# Patient Record
Sex: Male | Born: 1995
Health system: Southern US, Community
[De-identification: ages and names within clinical notes are randomized; demographics above are authoritative.]

## PROBLEM LIST (undated history)

## (undated) DIAGNOSIS — F329 Major depressive disorder, single episode, unspecified: Secondary | ICD-10-CM

## (undated) DIAGNOSIS — R519 Headache, unspecified: Secondary | ICD-10-CM

## (undated) DIAGNOSIS — R51 Headache: Secondary | ICD-10-CM

## (undated) DIAGNOSIS — F32A Depression, unspecified: Secondary | ICD-10-CM

## (undated) HISTORY — DX: Headache, unspecified: R51.9

## (undated) HISTORY — DX: Major depressive disorder, single episode, unspecified: F32.9

## (undated) HISTORY — DX: Depression, unspecified: F32.A

## (undated) HISTORY — DX: Headache: R51

---
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
## 2015-06-30 DIAGNOSIS — J0300 Acute streptococcal tonsillitis, unspecified: Secondary | ICD-10-CM | POA: Diagnosis not present

## 2015-09-22 DIAGNOSIS — R079 Chest pain, unspecified: Secondary | ICD-10-CM | POA: Diagnosis not present

## 2015-09-22 DIAGNOSIS — M549 Dorsalgia, unspecified: Secondary | ICD-10-CM | POA: Diagnosis not present

## 2015-09-22 DIAGNOSIS — M940 Chondrocostal junction syndrome [Tietze]: Secondary | ICD-10-CM | POA: Diagnosis not present

## 2016-01-17 DIAGNOSIS — R110 Nausea: Secondary | ICD-10-CM | POA: Diagnosis not present

## 2016-01-17 DIAGNOSIS — Z888 Allergy status to other drugs, medicaments and biological substances status: Secondary | ICD-10-CM | POA: Diagnosis not present

## 2016-01-17 DIAGNOSIS — Z880 Allergy status to penicillin: Secondary | ICD-10-CM | POA: Diagnosis not present

## 2016-01-17 DIAGNOSIS — F17200 Nicotine dependence, unspecified, uncomplicated: Secondary | ICD-10-CM | POA: Diagnosis not present

## 2016-01-17 DIAGNOSIS — R51 Headache: Secondary | ICD-10-CM | POA: Diagnosis not present

## 2016-01-17 DIAGNOSIS — Z720 Tobacco use: Secondary | ICD-10-CM | POA: Diagnosis not present

## 2016-01-17 DIAGNOSIS — H53149 Visual discomfort, unspecified: Secondary | ICD-10-CM | POA: Diagnosis not present

## 2016-02-29 DIAGNOSIS — J0300 Acute streptococcal tonsillitis, unspecified: Secondary | ICD-10-CM | POA: Diagnosis not present

## 2016-02-29 DIAGNOSIS — J029 Acute pharyngitis, unspecified: Secondary | ICD-10-CM | POA: Diagnosis not present

## 2016-02-29 DIAGNOSIS — R05 Cough: Secondary | ICD-10-CM | POA: Diagnosis not present

## 2016-03-02 DIAGNOSIS — J020 Streptococcal pharyngitis: Secondary | ICD-10-CM | POA: Diagnosis not present

## 2016-07-31 DIAGNOSIS — R197 Diarrhea, unspecified: Secondary | ICD-10-CM | POA: Diagnosis not present

## 2016-07-31 DIAGNOSIS — R509 Fever, unspecified: Secondary | ICD-10-CM | POA: Diagnosis not present

## 2016-07-31 DIAGNOSIS — R5383 Other fatigue: Secondary | ICD-10-CM | POA: Diagnosis not present

## 2016-07-31 DIAGNOSIS — S0096XA Insect bite (nonvenomous) of unspecified part of head, initial encounter: Secondary | ICD-10-CM | POA: Diagnosis not present

## 2016-08-01 DIAGNOSIS — R509 Fever, unspecified: Secondary | ICD-10-CM | POA: Diagnosis not present

## 2016-08-01 DIAGNOSIS — R197 Diarrhea, unspecified: Secondary | ICD-10-CM | POA: Diagnosis not present

## 2016-09-13 ENCOUNTER — Ambulatory Visit
Admission: EM | Admit: 2016-09-13 | Discharge: 2016-09-13 | Disposition: A | Discharge status: Home / Self Care | Payer: Worker's Compensation | Attending: Family Medicine | Admitting: Family Medicine

## 2016-09-13 DIAGNOSIS — Z23 Encounter for immunization: Secondary | ICD-10-CM

## 2016-09-13 DIAGNOSIS — T6591XA Toxic effect of unspecified substance, accidental (unintentional), initial encounter: Secondary | ICD-10-CM

## 2016-09-13 DIAGNOSIS — T22211A Burn of second degree of right forearm, initial encounter: Secondary | ICD-10-CM

## 2016-09-13 MED ORDER — TETANUS-DIPHTH-ACELL PERTUSSIS 5-2.5-18.5 LF-MCG/0.5 IM SUSP
0.5000 mL | Freq: Once | INTRAMUSCULAR | Status: AC
  Administered 2016-09-13: 0.5 mL via INTRAMUSCULAR

## 2016-09-13 MED ORDER — NAPROXEN 500 MG PO TABS: 500.0000 mg | ORAL_TABLET | Freq: Two times a day (BID) | ORAL | 0 refills | Status: DC

## 2016-09-13 MED ORDER — MUPIROCIN 2 % EX OINT: 1.0000 "application " | TOPICAL_OINTMENT | Freq: Three times a day (TID) | CUTANEOUS | 0 refills | Status: DC

## 2016-09-13 NOTE — ED Triage Notes (Signed)
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 Pain 7/10. Skin pink in areas of contact.
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
## 2016-10-11 DIAGNOSIS — H35413 Lattice degeneration of retina, bilateral: Secondary | ICD-10-CM | POA: Diagnosis not present

## 2016-10-11 DIAGNOSIS — H43812 Vitreous degeneration, left eye: Secondary | ICD-10-CM | POA: Diagnosis not present

## 2016-10-26 ENCOUNTER — Ambulatory Visit (INDEPENDENT_AMBULATORY_CARE_PROVIDER_SITE_OTHER): Payer: BLUE CROSS/BLUE SHIELD | Admitting: Family Medicine

## 2016-10-26 ENCOUNTER — Encounter: Payer: Self-pay | Admitting: Family Medicine

## 2016-10-26 VITALS — BP 120/80 | HR 101 | Temp 98.6°F | Ht 70.0 in | Wt 296.6 lb

## 2016-10-26 DIAGNOSIS — Z0000 Encounter for general adult medical examination without abnormal findings: Secondary | ICD-10-CM

## 2016-10-26 DIAGNOSIS — Z23 Encounter for immunization: Secondary | ICD-10-CM | POA: Diagnosis not present

## 2016-10-26 NOTE — Patient Instructions (Signed)
Let me know if you decide to pursue sleep study or any labs.

## 2016-10-26 NOTE — Progress Notes (Signed)
Subjective:     Patient ID: Jeffery Barrett, male   DOB: 18-Oct-1995, 21 y.o.   MRN: [id number]  HPI Patient seen to establish care and requesting well visit/physical. He is requiring this for his work. He currently takes no medications. He states has been diagnosed in the past with attention deficit disorder and obsessive-compulsive disorder. He apparently is treated with medications for OCD but did not like the way they made him feel. He states he felt somewhat "flat ".  He has history of obesity and has lost already about 25 pounds this year due to his efforts with scaling back sugars and starches. He has history of very large tonsils and suspected sleep apnea but has never been studied. Denies any significant daytime somnolence. He states his parents have observed that he snores loudly and sometimes quits breathing at night. He has declined further studies at this time.  Patient is single. Graduated from technical school for Runner, broadcasting/film/video. Smokes electronic cigarettes but not daily. Occasional alcohol use but not regularly. Denies illicit drug use.  Current job is Careers adviser.    Had tetanus booster one month ago. Requesting flu vaccine. No contraindications.  Past Medical History:  Diagnosis Date  . Depression    No past surgical history on file.  reports that he has quit smoking. His smoking use included E-cigarettes. He has never used smokeless tobacco. He reports that he drinks alcohol. He reports that he does not use drugs. family history includes Arthritis in his paternal grandmother; COPD in his maternal grandfather; Cancer in his maternal grandmother; Depression in his father, paternal grandfather, and sister; Early death in his maternal grandfather; Hearing loss in his father; Heart disease in his paternal grandfather and paternal grandmother; Hypertension in his mother and paternal grandmother; Miscarriages / [place] in his mother; Stroke in his paternal  grandmother. Allergies  Allergen Reactions  . Amoxicillin Hives  . Cephalosporins Hives     Review of Systems  Constitutional: Negative for activity change, appetite change and fever.  HENT: Negative for congestion, ear pain and trouble swallowing.   Eyes: Negative for pain and visual disturbance.  Respiratory: Negative for cough, shortness of breath and wheezing.   Cardiovascular: Negative for chest pain and palpitations.  Gastrointestinal: Negative for abdominal distention, abdominal pain, blood in stool, constipation, diarrhea, nausea, rectal pain and vomiting.  Endocrine: Negative for polydipsia and polyuria.  Genitourinary: Negative for dysuria, hematuria and testicular pain.  Musculoskeletal: Negative for arthralgias and joint swelling.  Skin: Negative for rash.  Neurological: Negative for dizziness, syncope and headaches.  Hematological: Negative for adenopathy.  Psychiatric/Behavioral: Negative for confusion and dysphoric mood.       Objective:   Physical Exam  Constitutional: He is oriented to person, place, and time. He appears well-developed and well-nourished. No distress.  HENT:  Head: Normocephalic and atraumatic.  Right Ear: External ear normal.  Left Ear: External ear normal.  Patient is very large tonsils which are symmetric and without erythema or exudate  Eyes: Pupils are equal, round, and reactive to light. Conjunctivae and EOM are normal.  Neck: Normal range of motion. Neck supple. No thyromegaly present.  Cardiovascular: Normal rate, regular rhythm and normal heart sounds.   No murmur heard. Pulmonary/Chest: No respiratory distress. He has no wheezes. He has no rales.  Abdominal: Soft. Bowel sounds are normal. He exhibits no distension and no mass. There is no tenderness. There is no rebound and no guarding.  Musculoskeletal: He exhibits no edema.  Lymphadenopathy:  He has no cervical adenopathy.  Neurological: He is alert and oriented to person,  place, and time. He displays normal reflexes. No cranial nerve deficit.  Skin: No rash noted.  Psychiatric: He has a normal mood and affect.       Assessment:     Physical exam. Patient here to establish care. Several issues addressed as below. He has history of reported ADD and OCD currently not treated for either. Very likely has obstructive sleep apnea but is declining further evaluation at this time    Plan:     -Flu vaccine given -Encourage continue weight loss and discussed strategies for ongoing weight loss and maintenance -We suggested sleep study and discussed implications of obstructive sleep apnea and hopefully will improve with time with further weight loss.  He declines sleep study at this time. -Also discussed potential treatments for obsessive-compulsive disorder but at this point he feels this is not dysfunctional. -Recommend consider screening lab work and he declines  Kristian CoveyBruce W Burchette MD [facility]

## 2016-12-02 DIAGNOSIS — R112 Nausea with vomiting, unspecified: Secondary | ICD-10-CM | POA: Diagnosis not present

## 2016-12-02 DIAGNOSIS — A09 Infectious gastroenteritis and colitis, unspecified: Secondary | ICD-10-CM | POA: Diagnosis not present

## 2016-12-06 DIAGNOSIS — H43812 Vitreous degeneration, left eye: Secondary | ICD-10-CM | POA: Diagnosis not present

## 2016-12-06 DIAGNOSIS — H35413 Lattice degeneration of retina, bilateral: Secondary | ICD-10-CM | POA: Diagnosis not present

## 2017-01-20 ENCOUNTER — Encounter: Payer: Self-pay | Admitting: *Deleted

## 2017-01-20 ENCOUNTER — Ambulatory Visit: Payer: Self-pay | Admitting: Physician Assistant

## 2017-01-20 NOTE — Telephone Encounter (Signed)
This encounter was created in error - please disregard.

## 2017-05-03 ENCOUNTER — Ambulatory Visit: Payer: BLUE CROSS/BLUE SHIELD | Admitting: Family Medicine

## 2017-05-03 ENCOUNTER — Ambulatory Visit (INDEPENDENT_AMBULATORY_CARE_PROVIDER_SITE_OTHER)
Admission: RE | Admit: 2017-05-03 | Discharge: 2017-05-03 | Disposition: A | Discharge status: Home / Self Care | Payer: BLUE CROSS/BLUE SHIELD | Source: Ambulatory Visit | Attending: Family Medicine | Admitting: Family Medicine

## 2017-05-03 ENCOUNTER — Encounter: Payer: Self-pay | Admitting: Family Medicine

## 2017-05-03 DIAGNOSIS — R519 Headache, unspecified: Secondary | ICD-10-CM

## 2017-05-03 DIAGNOSIS — R51 Headache: Secondary | ICD-10-CM | POA: Diagnosis not present

## 2017-05-03 NOTE — Patient Instructions (Signed)
General Headache Without Cause A headache is pain or discomfort felt around the head or neck area. The specific cause of a headache may not be found. There are many causes and types of headaches. A few common ones are:  Tension headaches.  Migraine headaches.  Cluster headaches.  Chronic daily headaches.  Follow these instructions at home: Watch your condition for any changes. Take these steps to help with your condition: Managing pain  Take over-the-counter and prescription medicines only as told by your health care provider.  Lie down in a dark, quiet room when you have a headache.  If directed, apply ice to the head and neck area: ? Put ice in a plastic bag. ? Place a towel between your skin and the bag. ? Leave the ice on for 20 minutes, 2-3 times per day.  Use a heating pad or hot shower to apply heat to the head and neck area as told by your health care provider.  Keep lights dim if bright lights bother you or make your headaches worse. Eating and drinking  Eat meals on a regular schedule.  Limit alcohol use.  Decrease the amount of caffeine you drink, or stop drinking caffeine. General instructions  Keep all follow-up visits as told by your health care provider. This is important.  Keep a headache journal to help find out what may trigger your headaches. For example, write down: ? What you eat and drink. ? How much sleep you get. ? Any change to your diet or medicines.  Try massage or other relaxation techniques.  Limit stress.  Sit up straight, and do not tense your muscles.  Do not use tobacco products, including cigarettes, chewing tobacco, or e-cigarettes. If you need help quitting, ask your health care provider.  Exercise regularly as told by your health care provider.  Sleep on a regular schedule. Get 7-9 hours of sleep, or the amount recommended by your health care provider. Contact a health care provider if:  Your symptoms are not helped by  medicine.  You have a headache that is different from the usual headache.  You have nausea or you vomit.  You have a fever. Get help right away if:  Your headache becomes severe.  You have repeated vomiting.  You have a stiff neck.  You have a loss of vision.  You have problems with speech.  You have pain in the eye or ear.  You have muscular weakness or loss of muscle control.  You lose your balance or have trouble walking.  You feel faint or pass out.  You have confusion. This information is not intended to replace advice given to you by your health care provider. Make sure you discuss any questions you have with your health care provider. Document Released: 12/20/2004 Document Revised: 05/28/2015 Document Reviewed: 04/14/2014 Elsevier Interactive Patient Education  2018 Elsevier Inc.  
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
## 2017-05-05 ENCOUNTER — Telehealth: Payer: Self-pay | Admitting: *Deleted

## 2017-05-05 ENCOUNTER — Other Ambulatory Visit: Payer: Self-pay | Admitting: Family Medicine

## 2017-05-05 DIAGNOSIS — R519 Headache, unspecified: Secondary | ICD-10-CM

## 2017-05-05 DIAGNOSIS — R51 Headache: Principal | ICD-10-CM

## 2017-05-05 NOTE — Telephone Encounter (Signed)
Copied from CRM [id number]. Topic: Quick Communication - Other Results >> May 04, 2017 12:26 PM Jeffery Barrett wrote: Pt calling back for results of CT scan.  Pt states he is still having headaches Pt wants to know if anything he should not do. Pt wants to know if ok to go 4 wheeling this weekend

## 2017-05-05 NOTE — Telephone Encounter (Signed)
Left message on machine for patient to return our call.  CMR created

## 2017-05-05 NOTE — Telephone Encounter (Signed)
See result note.  

## 2017-05-05 NOTE — Telephone Encounter (Signed)
CT no abnormality.  I would NOT recommend 4 wheeling this weekend.  I would like him to be in touch by early next week if headache not improved.  If not better by then will recommend neurology evaluation.

## 2017-05-23 ENCOUNTER — Encounter: Payer: Self-pay | Admitting: Neurology

## 2017-05-25 ENCOUNTER — Encounter: Payer: Self-pay | Admitting: *Deleted

## 2017-05-25 ENCOUNTER — Ambulatory Visit: Payer: BLUE CROSS/BLUE SHIELD | Admitting: Family Medicine

## 2017-05-25 ENCOUNTER — Encounter: Payer: Self-pay | Admitting: Family Medicine

## 2017-05-25 VITALS — BP 100/64 | HR 86 | Temp 98.3°F | Ht 70.0 in | Wt 301.7 lb

## 2017-05-25 DIAGNOSIS — H6980 Other specified disorders of Eustachian tube, unspecified ear: Secondary | ICD-10-CM | POA: Diagnosis not present

## 2017-05-25 DIAGNOSIS — R42 Dizziness and giddiness: Secondary | ICD-10-CM | POA: Diagnosis not present

## 2017-05-25 DIAGNOSIS — J351 Hypertrophy of tonsils: Secondary | ICD-10-CM | POA: Diagnosis not present

## 2017-05-25 DIAGNOSIS — R51 Headache: Secondary | ICD-10-CM | POA: Diagnosis not present

## 2017-05-25 DIAGNOSIS — R509 Fever, unspecified: Secondary | ICD-10-CM | POA: Diagnosis not present

## 2017-05-25 DIAGNOSIS — R519 Headache, unspecified: Secondary | ICD-10-CM

## 2017-05-25 LAB — POCT RAPID STREP A (OFFICE): Rapid Strep A Screen: POSITIVE — AB

## 2017-05-25 LAB — POC INFLUENZA A&B (BINAX/QUICKVUE)
Influenza A, POC: NEGATIVE
Influenza B, POC: NEGATIVE

## 2017-05-25 MED ORDER — AZITHROMYCIN 250 MG PO TABS: ORAL_TABLET | ORAL | 0 refills | Status: DC

## 2017-05-25 NOTE — Patient Instructions (Addendum)
BEFORE YOU LEAVE: -rapid strep and flu testing -work note - do not return to work until 24 hours on antibiotic (05/27/17) -follow up: Monday or Tuesday for recheck with PCP  Take the antibiotic as instructed.  Plenty of fluids.  Keep neurology appointment.  Seek care if recurrent or worsening symptoms, high fevers, rash, severe or worsening headaches, other concerns.

## 2017-05-25 NOTE — Progress Notes (Addendum)
HPI:  Using dictation device. Unfortunately this device frequently misinterprets words/phrases.  Acute visit for headaches: -x 1 month -reports seeing PCP for this and had CT and has appointment with neurology for eval -2 days ago saw tick on him, lone star tick has pic -2 days ago started feeling sick with fevers, up to 104 at home per his report, nausea, body aches, feeling out of it, continue headaches -denies SOB, vision changes, weakness, numbness, confusion, sore throat, cough, rash, tick bite, joint pains, swelling or pain in joints, neck stiffness -feeling better today with fevers resolved  ROS: See pertinent positives and negatives per HPI.  Past Medical History:  Diagnosis Date  . Depression     History reviewed. No pertinent surgical history.  Family History  Problem Relation Age of Onset  . Hypertension Mother   . Miscarriages / [place] Mother   . Depression Father   . Hearing loss Father   . Depression Sister   . Cancer Maternal Grandmother   . COPD Maternal Grandfather   . Early death Maternal Grandfather   . Arthritis Paternal Grandmother   . Heart disease Paternal Grandmother   . Hypertension Paternal Grandmother   . Stroke Paternal Grandmother   . Depression Paternal Grandfather   . Heart disease Paternal Grandfather     SOCIAL HX: see hpi   Current Outpatient Medications:  .  azithromycin (ZITHROMAX) 250 MG tablet, 2 tabs day 1, then one tab daily, Disp: 6 tablet, Rfl: 0  EXAM:  Vitals:   05/25/17 1351  BP: 100/64  Pulse: 86  Temp: 98.3 F (36.8 C)  SpO2: 98%    Body mass index is 43.29 kg/m.  GENERAL: vitals reviewed and listed above, alert, oriented, appears well hydrated and in no acute distress  HEENT: atraumatic, conjunttiva clear, no obvious abnormalities on inspection of external nose and ears, normal appearance of ear canals and TMs with bilateral clear effusion, yellow nasal congestion bilaterally, mild post oropharyngeal  erythema with PND, 2+ tonsillar edema edema without exudate, no sinus TTP  NECK: no obvious masses on inspection  LUNGS: clear to auscultation bilaterally, no wheezes, rales or rhonchi, good air movement  CV: HRRR, no peripheral edema  MS: moves all extremities without noticeable abnormality  PSYCH: CN II-XII grossly intact, finger to nose normal, speech and thought processing grossly intact, pleasant and cooperative, no obvious depression or anxiety  ASSESSMENT AND PLAN:  Discussed the following assessment and plan:  Fever, unspecified fever cause - Plan: POC Influenza A&B(BINAX/QUICKVUE), POC Rapid Strep A  Large tonsils - Plan: POC Rapid Strep A  Dysfunction of Eustachian tube, unspecified laterality  Nonintractable headache, unspecified chronicity pattern, unspecified headache type  Lightheaded  -vitals are normal with normal exam today except for upper resp findings and large tonsils (he reports always has large tonsils) but agreed to strep testing -strep test positive - treat with antibiotic, azithromycin given allergies -discussed tick borne illnesses and unlikely given onset of symptoms the same day he saw a tick, tick not biting and lone star tick - but did want him to seek immediate care if any more fevers, or sick -scheduled for appt with neurology for the headaches and seems these are not changed -recheck with PCP next week, particularly if not continuing to improve -Patient advised to return or notify a doctor immediately if symptoms worsen or persist or new concerns arise.  Patient Instructions  BEFORE YOU LEAVE: -rapid strep and flu testing -work note - do not return to work  until 24 hours on antibiotic (05/27/17) -follow up: Monday or Tuesday for recheck with PCP  Take the antibiotic as instructed.  Plenty of fluids.  Keep neurology appointment.  Seek care if recurrent or worsening symptoms, high fevers, rash, severe or worsening headaches, other  concerns.      Terressa Koyanagi, DO

## 2017-05-25 NOTE — Addendum Note (Signed)
Addended by: Terressa Koyanagi on: 05/25/2017 04:14 PM   Modules accepted: Orders

## 2017-05-25 NOTE — Addendum Note (Signed)
Addended by: Johnella Moloney on: 05/25/2017 02:57 PM   Modules accepted: Orders

## 2017-05-31 ENCOUNTER — Encounter: Payer: Self-pay | Admitting: Family Medicine

## 2017-05-31 ENCOUNTER — Ambulatory Visit: Payer: BLUE CROSS/BLUE SHIELD | Admitting: Family Medicine

## 2017-05-31 VITALS — BP 110/80 | HR 97 | Temp 98.4°F | Wt 304.8 lb

## 2017-05-31 DIAGNOSIS — R51 Headache: Secondary | ICD-10-CM

## 2017-05-31 DIAGNOSIS — R519 Headache, unspecified: Secondary | ICD-10-CM

## 2017-05-31 NOTE — Progress Notes (Signed)
  Subjective:     Patient ID: Jeffery Barrett, male   DOB: 05-Jun-1995, 22 y.o.   MRN: [id number]  HPI Patient seen last week with sudden fevers up to 104. He was noted to have enlarged tonsils with some erythema and even though he denied any sore throat rapid strep came back positive. He was treated with Zithromax and is asymptomatic at this time. No recurrent fever.  He's had some fleeting atypical unilateral headaches recently and had recent CT head unremarkable. He has pending follow-up with neurology in July. Denies recent exertional headache.  Very high risk for obstructive sleep apnea. He states his parents have noted that he does snore and has had occasional observed apnea episodes. Has some daytime fatigue but no significant daytime somnolence.  Recent reported tick bite (lone star tick)- but no rash, arthralgia, persistent fever, etc.  Past Medical History:  Diagnosis Date  . Depression    No past surgical history on file.  reports that he has quit smoking. His smoking use included e-cigarettes. He has never used smokeless tobacco. He reports that he drinks alcohol. He reports that he does not use drugs. family history includes Arthritis in his paternal grandmother; COPD in his maternal grandfather; Cancer in his maternal grandmother; Depression in his father, paternal grandfather, and sister; Early death in his maternal grandfather; Hearing loss in his father; Heart disease in his paternal grandfather and paternal grandmother; Hypertension in his mother and paternal grandmother; Miscarriages / [place] in his mother; Stroke in his paternal grandmother. Allergies  Allergen Reactions  . Amoxicillin Hives  . Cephalosporins Hives     Review of Systems  Constitutional: Negative for chills and fever.  HENT: Negative for congestion.   Respiratory: Negative for cough.   Neurological: Positive for headaches. Negative for dizziness, seizures, syncope, facial asymmetry, speech  difficulty and weakness.  Hematological: Negative for adenopathy.       Objective:   Physical Exam  Constitutional: He is oriented to person, place, and time. He appears well-developed and well-nourished.  HENT:  He has symmetrically enlarged tonsils with no exudate and no significant erythema this time  Neck: Neck supple.  Cardiovascular: Normal rate and regular rhythm.  Pulmonary/Chest: Effort normal and breath sounds normal. He has no wheezes. He has no rales.  Lymphadenopathy:    He has no cervical adenopathy.  Neurological: He is alert and oriented to person, place, and time. No cranial nerve deficit.       Assessment:     #1 recent strep pharyngitis symptomatically resolved following Zithromax  #2 atypical unilateral headaches. Question neuralgic headaches. Neurology follow-up pending. Recent CT head unremarkable  #3 high-risk for obstructive sleep apnea    Plan:     -We discussed getting possible sleep study but at this point he wishes to wait. Would certainly pursue for any progressive fatigue or daytime somnolence issues -Follow-up for any recurrent fever or other concerns -keep neurology follow up.  Would like to  Get him in sooner if possible.    Kristian Covey MD [facility]

## 2017-06-04 ENCOUNTER — Encounter (HOSPITAL_COMMUNITY): Payer: Self-pay | Admitting: Emergency Medicine

## 2017-06-04 ENCOUNTER — Other Ambulatory Visit: Payer: Self-pay

## 2017-06-04 ENCOUNTER — Ambulatory Visit (HOSPITAL_COMMUNITY)
Admission: EM | Admit: 2017-06-04 | Discharge: 2017-06-04 | Disposition: A | Discharge status: Home / Self Care | Payer: BLUE CROSS/BLUE SHIELD | Attending: Family Medicine | Admitting: Family Medicine

## 2017-06-04 DIAGNOSIS — J020 Streptococcal pharyngitis: Secondary | ICD-10-CM

## 2017-06-04 MED ORDER — CLINDAMYCIN HCL 300 MG PO CAPS: 300.0000 mg | ORAL_CAPSULE | Freq: Three times a day (TID) | ORAL | 0 refills | Status: DC

## 2017-06-04 NOTE — ED Triage Notes (Signed)
Finished medication for strep throat on Monday 5/27.  Felt good at that time.   Throat started getting sore again yesterday, feeling tired, general body aches, chills

## 2017-06-04 NOTE — ED Provider Notes (Signed)
[facility]    CSN: [id number] Arrival date & time: 06/04/17  1928     History   Chief Complaint Chief Complaint  Patient presents with  . Sore Throat    HPI Jeffery Barrett is a 22 y.o. male.   HPI  Patient states that he had strep throat about 2 weeks ago.  He went to a different urgent care center.  He was diagnosed with a rapid strep test.  He was treated with a Z-Pak.  His last dose of azithromycin was 5/27 / 19.  He states that he felt better on the antibiotic, then day before yesterday he started to feel tired, achy, and ran a fever.  Today he has a painful sore throat.  No runny nose.  No cough.  No nausea or vomiting.  He states it feels like he did when he had the strep. He has an allergy to penicillin and cephalosporins.  They both cause rash. He is here with his mother He is otherwise healthy Past Medical History:  Diagnosis Date  . Depression     There are no active problems to display for this patient.   History reviewed. No pertinent surgical history.     Home Medications    Prior to Admission medications   Medication Sig Start Date End Date Taking? Authorizing Provider  clindamycin (CLEOCIN) 300 MG capsule Take 1 capsule (300 mg total) by mouth 3 (three) times daily. 06/04/17   Eustace MooreNelson, Devonda Pequignot Sue, MD    Family History Family History  Problem Relation Age of Onset  . Hypertension Mother   . Miscarriages / [place] Mother   . Depression Father   . Hearing loss Father   . Depression Sister   . Cancer Maternal Grandmother   . COPD Maternal Grandfather   . Early death Maternal Grandfather   . Arthritis Paternal Grandmother   . Heart disease Paternal Grandmother   . Hypertension Paternal Grandmother   . Stroke Paternal Grandmother   . Depression Paternal Grandfather   . Heart disease Paternal Grandfather     Social History Social History   Tobacco Use  . Smoking status: Former Smoker    Types: E-cigarettes  . Smokeless  tobacco: Never Used  Substance Use Topics  . Alcohol use: Yes    Comment: social  . Drug use: No     Allergies   Amoxicillin and Cephalosporins   Review of Systems Review of Systems  Constitutional: Positive for diaphoresis and fever. Negative for chills.  HENT: Positive for sore throat. Negative for ear pain, postnasal drip and rhinorrhea.   Eyes: Negative for pain and visual disturbance.  Respiratory: Negative for cough and shortness of breath.   Cardiovascular: Negative for chest pain and palpitations.  Gastrointestinal: Negative for abdominal pain and vomiting.  Genitourinary: Negative for dysuria and hematuria.  Musculoskeletal: Positive for myalgias. Negative for arthralgias and back pain.  Skin: Negative for color change and rash.  Neurological: Negative for seizures, syncope and headaches.  All other systems reviewed and are negative.    Physical Exam Triage Vital Signs ED Triage Vitals  Enc Vitals Group     BP 06/04/17 2021 129/79     Pulse Rate 06/04/17 2021 (!) 104     Resp 06/04/17 2021 18     Temp 06/04/17 2021 99.6 F (37.6 C)     Temp Source 06/04/17 2021 Oral     SpO2 06/04/17 2021 100 %     Weight --  Height --      Head Circumference --      Peak Flow --      Pain Score 06/04/17 2019 8     Pain Loc --      Pain Edu? --      Excl. in GC? --    No data found.  Updated Vital Signs BP 129/79 (BP Location: Left Arm)   Pulse (!) 104   Temp 99.6 F (37.6 C) (Oral)   Resp 18   SpO2 100%   Visual Acuity Right Eye Distance:   Left Eye Distance:   Bilateral Distance:    Right Eye Near:   Left Eye Near:    Bilateral Near:     Physical Exam  Constitutional: He appears well-developed and well-nourished. No distress.  HENT:  Head: Normocephalic and atraumatic.  Right Ear: Tympanic membrane and ear canal normal.  Left Ear: Tympanic membrane and ear canal normal.  Mouth/Throat: Mucous membranes are normal. Posterior oropharyngeal erythema  present. Tonsils are 4+ on the right. Tonsils are 4+ on the left. No tonsillar exudate.  Tonsils enlarged, erythematous, touching midline.  No obvious exudate  Eyes: Pupils are equal, round, and reactive to light. Conjunctivae are normal.  Neck: Normal range of motion.  Cardiovascular: Normal rate, regular rhythm and normal heart sounds.  Pulmonary/Chest: Effort normal and breath sounds normal. No respiratory distress.  Abdominal: Soft. Bowel sounds are normal. He exhibits no distension.  Musculoskeletal: Normal range of motion. He exhibits no edema.  Lymphadenopathy:    He has cervical adenopathy.  Neurological: He is alert.  Skin: Skin is warm and dry.     UC Treatments / Results  Labs (all labs ordered are listed, but only abnormal results are displayed) Labs Reviewed - No data to display  EKG None  Radiology No results found.  Procedures Procedures (including critical care time)  Medications Ordered in UC Medications - No data to display  Initial Impression / Assessment and Plan / UC Course  I have reviewed the triage vital signs and the nursing notes.  Pertinent labs & imaging results that were available during my care of the patient were reviewed by me and considered in my medical decision making (see chart for details).     Discussed that second rapid strep test, if negative or if positive is not going to change my management.  I believe he needs another course of antibiotics.  I think he has strep resistant to azithromycin.  He is not presenting with viral symptoms.  He does not have adenopathy consistent with mono.  We will give him 10 days of clindamycin.  He is instructed to take this with food. Final Clinical Impressions(s) / UC Diagnoses   Final diagnoses:  Strep pharyngitis     Discharge Instructions     Rest Push fluids Tylenol or ibuprofen for pain and fever Off work 2 days   ED Prescriptions    Medication Sig Dispense Auth. Provider    clindamycin (CLEOCIN) 300 MG capsule Take 1 capsule (300 mg total) by mouth 3 (three) times daily. 30 capsule Eustace Moore, MD     Controlled Substance Prescriptions  Controlled Substance Registry consulted? Not Applicable   Eustace Moore, MD 06/04/17 2136

## 2017-06-04 NOTE — Discharge Instructions (Signed)
Rest Push fluids Tylenol or ibuprofen for pain and fever Off work 2 days

## 2017-07-28 ENCOUNTER — Encounter: Payer: Self-pay | Admitting: Neurology

## 2017-07-28 ENCOUNTER — Ambulatory Visit: Payer: BLUE CROSS/BLUE SHIELD | Admitting: Neurology

## 2017-07-28 ENCOUNTER — Other Ambulatory Visit: Payer: Self-pay

## 2017-07-28 VITALS — BP 114/78 | HR 74 | Ht 68.0 in | Wt 307.0 lb

## 2017-07-28 DIAGNOSIS — M5481 Occipital neuralgia: Secondary | ICD-10-CM

## 2017-07-28 MED ORDER — GABAPENTIN 100 MG PO CAPS: ORAL_CAPSULE | ORAL | 0 refills | Status: DC

## 2017-07-28 NOTE — Progress Notes (Signed)
NEUROLOGY CONSULTATION NOTE  Jeffery Barrett MRN: [id number] DOB: [birth year]  Referring provider: Dr. Caryl Never Primary care provider: Dr. Caryl Never  Reason for consult:  headache  HISTORY OF PRESENT ILLNESS: Jeffery Barrett is a 22 year old male who presents for headache.  He is accompanied by his mother who supplements history.   He started having headaches last year.  They lasted a while and then resolved.  The returned about 3 months ago.  He describes a severe shooting pain from the left suboccipital region that radiates up the left side of his occiput.  It lasts 1 to 2 minutes and occurs about twice a day.  There is no associated neck pain but he has some left shoulder pain.  No radicular pain, numbness or weakness down the left arm.  Neck movement does not aggravate it.  It occurs and resolves spontaneously.  No associated visual disturbance, nausea, vomiting, photophobia, phonophobia, autonomic symptoms or unilateral numbness or weakness.  He has not taken any medication for it.  It has been so severe that he has missed work (during periods when it would occur several times daily).  PAST MEDICAL HISTORY: Past Medical History:  Diagnosis Date  . Depression   . Headache     PAST SURGICAL HISTORY: No past surgical history on file.  MEDICATIONS: No current outpatient medications on file prior to visit.   No current facility-administered medications on file prior to visit.     ALLERGIES: Allergies  Allergen Reactions  . Amoxicillin Hives  . Cephalosporins Hives    FAMILY HISTORY: Family History  Problem Relation Age of Onset  . Hypertension Mother   . Miscarriages / [place] Mother   . Thyroid disease Mother   . Depression Father   . Hearing loss Father   . Obstructive Sleep Apnea Father   . Depression Sister   . Polycystic ovary syndrome Sister   . Interstitial cystitis Sister   . Cancer Maternal Grandmother   . Lung cancer Maternal  Grandmother   . COPD Maternal Grandfather   . Early death Maternal Grandfather   . Arthritis Paternal Grandmother   . Heart disease Paternal Grandmother   . Hypertension Paternal Grandmother   . Stroke Paternal Grandmother   . Diabetes Paternal Grandmother   . Depression Paternal Grandfather   . Heart disease Paternal Grandfather   . Scoliosis Paternal Grandfather   . Scoliosis Maternal Aunt     SOCIAL HISTORY: Social History   Socioeconomic History  . Marital status: Single    Spouse name: Not on file  . Number of children: Not on file  . Years of education: Not on file  . Highest education level: Associate degree: occupational, Scientist, product/process development, or vocational program  Occupational History  . 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 Financial resource strain: Not on file  . Food insecurity:    Worry: Not on file    Inability: Not on file  . Transportation needs:    Medical: Not on file    Non-medical: Not on file  Tobacco Use  . Smoking status: Former Smoker    Types: E-cigarettes  . Smokeless tobacco: Never Used  Substance and Sexual Activity  . Alcohol use: Yes    Comment: social  . Drug use: No  . Sexual activity: Not on file  Lifestyle  . Physical activity:    Days per week: Not on file    Minutes per session: Not on  file  . Stress: Not on file  Relationships  . Social connections:    Talks on phone: Not on file    Gets together: Not on file    Attends religious service: Not on file    Active member of club or organization: Not on file    Attends meetings of clubs or organizations: Not on file    Relationship status: Not on file  . Intimate partner violence:    Fear of current or ex partner: Not on file    Emotionally abused: Not on file    Physically abused: Not on file    Forced sexual activity: Not on file  Other Topics Concern  . Not on file  Social History Narrative   Patient is right-handed. He lives with  his parents in a 2 story house with basement. His bedroom is on the main level. He occasionally drinks tea, avoids caffeine otherwise. He has been unable to work out at the gym due to back and shoulder pain in the last several months.    REVIEW OF SYSTEMS: Constitutional: No fevers, chills, or sweats, no generalized fatigue, change in appetite Eyes: No visual changes, double vision, eye pain Ear, nose and throat: No hearing loss, ear pain, nasal congestion, sore throat Cardiovascular: No chest pain, palpitations Respiratory:  No shortness of breath at rest or with exertion, wheezes GastrointestinaI: No nausea, vomiting, diarrhea, abdominal pain, fecal incontinence Genitourinary:  No dysuria, urinary retention or frequency Musculoskeletal:  No neck pain, back pain Integumentary: No rash, pruritus, skin lesions Neurological: as above Psychiatric: No depression, insomnia, anxiety Endocrine: No palpitations, fatigue, diaphoresis, mood swings, change in appetite, change in weight, increased thirst Hematologic/Lymphatic:  No purpura, petechiae. Allergic/Immunologic: no itchy/runny eyes, nasal congestion, recent allergic reactions, rashes  PHYSICAL EXAM: Vitals:   07/28/17 1114  BP: 114/78  Pulse: 74  SpO2: 98%   General: No acute distress.  Patient appears well-groomed.  Head:  Normocephalic/atraumatic, very mild left lower occipital tenderness Eyes:  fundi examined but not visualized Neck: supple, no paraspinal tenderness, full range of motion Back: No paraspinal tenderness Heart: regular rate and rhythm Lungs: Clear to auscultation bilaterally. Vascular: No carotid bruits. Neurological Exam: Mental status: alert and oriented to person, place, and time, recent and remote memory intact, fund of knowledge intact, attention and concentration intact, speech fluent and not dysarthric, language intact. Cranial nerves: CN I: not tested CN II: pupils equal, round and reactive to light,  visual fields intact CN III, IV, VI:  full range of motion, no nystagmus, no ptosis CN V: facial sensation intact CN VII: upper and lower face symmetric CN VIII: hearing intact CN IX, X: gag intact, uvula midline CN XI: sternocleidomastoid and trapezius muscles intact CN XII: tongue midline Bulk & Tone: normal, no fasciculations. Motor:  5/5 throughout  Sensation: temperature and vibration sensation intact. Deep Tendon Reflexes:  2+ throughout, toes downgoing.  Finger to nose testing:  Without dysmetria.  Heel to shin:  Without dysmetria.  Gait:  Normal station and stride.  Romberg negative.  IMPRESSION: Left sided occipital neuralgia.  He also reports left shoulder pain.  Uncertain if related (cervicogenic).  PLAN: 1.  Initiate gabapentin 100mg  and titrate to 300mg  at bedtime.  If not improved in 2 weeks, he will contact me and we can increase to twice daily dosing.   2.  Follow up in 3 to 4 months.  Thank you for allowing me to take part in the care of this patient.  40  minutes spent face to face with patient, over 50% spent discussing management.  Shon Millet, DO  CC:  Evelena Peat, MD

## 2017-07-28 NOTE — Patient Instructions (Signed)
1.  Start gabapentin 100mg  capsules:  Take 1 capsule at bedtime for 7 days,  Then 2 capsules at bedtime for 7 days  Then 3 capsules at bedtime If headache not improved after you have been on 3 capsules at bedtime for 2 weeks, contact me and we can increase dose. 2.  Follow up in 4 months but contact me with any questions and concerns.

## 2017-08-27 ENCOUNTER — Other Ambulatory Visit: Payer: Self-pay | Admitting: Neurology

## 2017-09-08 DIAGNOSIS — Z23 Encounter for immunization: Secondary | ICD-10-CM | POA: Diagnosis not present

## 2017-09-08 DIAGNOSIS — J020 Streptococcal pharyngitis: Secondary | ICD-10-CM | POA: Diagnosis not present

## 2017-10-06 ENCOUNTER — Encounter: Payer: Self-pay | Admitting: Family Medicine

## 2017-10-06 ENCOUNTER — Ambulatory Visit (INDEPENDENT_AMBULATORY_CARE_PROVIDER_SITE_OTHER): Payer: BLUE CROSS/BLUE SHIELD | Admitting: Family Medicine

## 2017-10-06 ENCOUNTER — Other Ambulatory Visit: Payer: Self-pay

## 2017-10-06 VITALS — BP 122/70 | HR 92 | Temp 98.4°F | Ht 65.5 in | Wt 307.6 lb

## 2017-10-06 DIAGNOSIS — R400 Somnolence: Secondary | ICD-10-CM | POA: Diagnosis not present

## 2017-10-06 DIAGNOSIS — Z0000 Encounter for general adult medical examination without abnormal findings: Secondary | ICD-10-CM | POA: Diagnosis not present

## 2017-10-06 LAB — CBC WITH DIFFERENTIAL/PLATELET
Basophils Absolute: 0.1 10*3/uL (ref 0.0–0.1)
Basophils Relative: 0.6 % (ref 0.0–3.0)
Eosinophils Absolute: 0.4 10*3/uL (ref 0.0–0.7)
Eosinophils Relative: 3.9 % (ref 0.0–5.0)
HCT: 43.9 % (ref 39.0–52.0)
Hemoglobin: 15.1 g/dL (ref 13.0–17.0)
Lymphocytes Relative: 22.4 % (ref 12.0–46.0)
Lymphs Abs: 2.1 10*3/uL (ref 0.7–4.0)
MCHC: 34.4 g/dL (ref 30.0–36.0)
MCV: 83.2 fl (ref 78.0–100.0)
Monocytes Absolute: 0.7 10*3/uL (ref 0.1–1.0)
Monocytes Relative: 7 % (ref 3.0–12.0)
Neutro Abs: 6.2 10*3/uL (ref 1.4–7.7)
Neutrophils Relative %: 66.1 % (ref 43.0–77.0)
Platelets: 343 10*3/uL (ref 150.0–400.0)
RBC: 5.27 Mil/uL (ref 4.22–5.81)
RDW: 13.3 % (ref 11.5–15.5)
WBC: 9.4 10*3/uL (ref 4.0–10.5)

## 2017-10-06 LAB — BASIC METABOLIC PANEL
BUN: 16 mg/dL (ref 6–23)
CO2: 27 mEq/L (ref 19–32)
Calcium: 9.8 mg/dL (ref 8.4–10.5)
Chloride: 105 mEq/L (ref 96–112)
Creatinine, Ser: 1.05 mg/dL (ref 0.40–1.50)
GFR: 93.09 mL/min (ref >60.00)
Glucose, Bld: 88 mg/dL (ref 70–99)
Potassium: 4.2 mEq/L (ref 3.5–5.1)
Sodium: 140 mEq/L (ref 135–145)

## 2017-10-06 LAB — LIPID PANEL
Cholesterol: 159 mg/dL (ref 0–200)
HDL: 36.8 mg/dL — ABNORMAL LOW (ref >39.00)
LDL Cholesterol: 99 mg/dL (ref 0–99)
NonHDL: 122.5
Total CHOL/HDL Ratio: 4
Triglycerides: 117 mg/dL (ref 0.0–149.0)
VLDL: 23.4 mg/dL (ref 0.0–40.0)

## 2017-10-06 LAB — HEPATIC FUNCTION PANEL
ALT: 39 U/L (ref 0–53)
AST: 26 U/L (ref 0–37)
Albumin: 4.7 g/dL (ref 3.5–5.2)
Alkaline Phosphatase: 32 U/L — ABNORMAL LOW (ref 39–117)
Bilirubin, Direct: 0.1 mg/dL (ref 0.0–0.3)
Total Bilirubin: 0.4 mg/dL (ref 0.2–1.2)
Total Protein: 8.1 g/dL (ref 6.0–8.3)

## 2017-10-06 LAB — TSH: TSH: 4.72 u[IU]/mL — ABNORMAL HIGH (ref 0.35–4.50)

## 2017-10-06 NOTE — Patient Instructions (Signed)
We will set up referral for sleep study.

## 2017-10-06 NOTE — Progress Notes (Signed)
Subjective:     Patient ID: Jeffery Barrett, male   DOB: [birth year], 22 y.o.   MRN: [id number]  HPI Patient seen for physical exam.  Last year he had some atypical headaches.  CAT scan was unremarkable.  He was seen neurology and likely has occipital neuralgia.  No recent headaches.  Major concern is increasing fatigue and daytime somnolence.  We expressed our concern for possible obstructive sleep apnea previously.  He is willing to get studied at this time.  Has never been studied previously.  Has had observed snoring and questionable apnea episodes  Non-smoker.  Repairs heavy equipment.  No regular alcohol use. Tetanus up-to-date.  Had flu vaccine September 6.  Past Medical History:  Diagnosis Date  . Depression   . Headache    History reviewed. No pertinent surgical history.  reports that he has quit smoking. His smoking use included e-cigarettes. He has never used smokeless tobacco. He reports that he drinks alcohol. He reports that he does not use drugs. family history includes Arthritis in his paternal grandmother; COPD in his maternal grandfather; Cancer in his maternal grandmother; Depression in his father, paternal grandfather, and sister; Diabetes in his paternal grandmother; Early death in his maternal grandfather; Hearing loss in his father; Heart disease in his paternal grandfather and paternal grandmother; Hypertension in his mother and paternal grandmother; Interstitial cystitis in his sister; Lung cancer in his maternal grandmother; Miscarriages / Stillbirths in his mother; Obstructive Sleep Apnea in his father; Polycystic ovary syndrome in his sister; Scoliosis in his maternal aunt and paternal grandfather; Stroke in his paternal grandmother; Thyroid disease in his mother. Allergies  Allergen Reactions  . Amoxicillin Hives  . Cephalosporins Hives     Review of Systems  Constitutional: Positive for fatigue. Negative for activity change, appetite change and fever.   HENT: Negative for congestion, ear pain and trouble swallowing.   Eyes: Negative for pain and visual disturbance.  Respiratory: Negative for cough, shortness of breath and wheezing.   Cardiovascular: Negative for chest pain and palpitations.  Gastrointestinal: Negative for abdominal distention, abdominal pain, blood in stool, constipation, diarrhea, nausea, rectal pain and vomiting.  Genitourinary: Negative for dysuria, hematuria and testicular pain.  Musculoskeletal: Negative for arthralgias and joint swelling.  Skin: Negative for rash.  Neurological: Negative for dizziness, syncope and headaches.  Hematological: Negative for adenopathy.  Psychiatric/Behavioral: Negative for confusion and dysphoric mood.       Objective:   Physical Exam  Constitutional: He is oriented to person, place, and time. He appears well-developed and well-nourished. No distress.  HENT:  Head: Normocephalic and atraumatic.  Right Ear: External ear normal.  Left Ear: External ear normal.  Mouth/Throat: Oropharynx is clear and moist.  He has fairly large tonsils but no erythema or exudate  Eyes: Pupils are equal, round, and reactive to light. Conjunctivae and EOM are normal.  Neck: Normal range of motion. Neck supple. No thyromegaly present.  Cardiovascular: Normal rate, regular rhythm and normal heart sounds.  No murmur heard. Pulmonary/Chest: No respiratory distress. He has no wheezes. He has no rales.  Abdominal: Soft. Bowel sounds are normal. He exhibits no distension and no mass. There is no tenderness. There is no rebound and no guarding.  Musculoskeletal: He exhibits no edema.  Lymphadenopathy:    He has no cervical adenopathy.  Neurological: He is alert and oriented to person, place, and time. He displays normal reflexes. No cranial nerve deficit.  Skin: No rash noted.  Psychiatric: He has a normal  mood and affect.       Assessment:     Physical exam.  Patient has morbid obesity and high risk  for obstructive sleep apnea    Plan:     -Set up referral to pulmonary for sleep study -Check screening lab work -Advised losing some weight  Kristian Covey MD  Primary Care at [facility]

## 2017-11-01 ENCOUNTER — Encounter: Payer: Self-pay | Admitting: Pulmonary Disease

## 2017-11-01 ENCOUNTER — Ambulatory Visit: Payer: BLUE CROSS/BLUE SHIELD | Admitting: Pulmonary Disease

## 2017-11-01 VITALS — BP 138/88 | HR 93

## 2017-11-01 DIAGNOSIS — G4719 Other hypersomnia: Secondary | ICD-10-CM | POA: Diagnosis not present

## 2017-11-01 DIAGNOSIS — R0683 Snoring: Secondary | ICD-10-CM | POA: Diagnosis not present

## 2017-11-01 NOTE — Progress Notes (Signed)
Jeffery Barrett    [id number]    [birth year]  Primary Care Physician:Burchette, Elberta Fortis, MD  Referring Physician: Kristian Covey, MD [address] [place], [place] [address]  Chief complaint:  Patient with excessive daytime sleepiness  HPI:  Worsening daytime sleepiness over the last few years Usually goes to bed between 9 and 10, takes him about 30 minutes to fall asleep next wakes up about 7 AM Worsening daytime sleepiness Denies any shortness of breath  No dryness of his mouth in the mornings No headaches  Dad has OSA, uses CPAP  His weight has not changed recently Exercises on a regular basis  No pertinent occupational history  Outpatient Encounter Medications as of 11/01/2017  Medication Sig  . Multiple Vitamins-Minerals (MULTIVITAMIN ADULT PO) Take 1 tablet by mouth daily.  . [DISCONTINUED] gabapentin (NEURONTIN) 100 MG capsule 3 caps QHS   No facility-administered encounter medications on file as of 11/01/2017.     Allergies as of 11/01/2017 - Review Complete 11/01/2017  Allergen Reaction Noted  . Amoxicillin Hives 09/13/2016  . Cephalosporins Hives 09/13/2016    Past Medical History:  Diagnosis Date  . Depression   . Headache     No past surgical history on file.  Family History  Problem Relation Age of Onset  . Hypertension Mother   . Miscarriages / [place] Mother   . Thyroid disease Mother   . Depression Father   . Hearing loss Father   . Obstructive Sleep Apnea Father   . Depression Sister   . Polycystic ovary syndrome Sister   . Interstitial cystitis Sister   . Cancer Maternal Grandmother   . Lung cancer Maternal Grandmother   . COPD Maternal Grandfather   . Early death Maternal Grandfather   . Arthritis Paternal Grandmother   . Heart disease Paternal Grandmother   . Hypertension Paternal Grandmother   . Stroke Paternal Grandmother   . Diabetes Paternal Grandmother   . Depression Paternal Grandfather     . Heart disease Paternal Grandfather   . Scoliosis Paternal Grandfather   . Scoliosis Maternal Aunt     Social History   Socioeconomic History  . Marital status: Single    Spouse name: Not on file  . Number of children: Not on file  . Years of education: Not on file  . Highest education level: Associate degree: occupational, Scientist, product/process development, or vocational program  Occupational History  . 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 Financial resource strain: Not on file  . Food insecurity:    Worry: Not on file    Inability: Not on file  . Transportation needs:    Medical: Not on file    Non-medical: Not on file  Tobacco Use  . Smoking status: Former Smoker    Types: E-cigarettes  . Smokeless tobacco: Never Used  Substance and Sexual Activity  . Alcohol use: Yes    Comment: social  . Drug use: No  . Sexual activity: Not on file  Lifestyle  . Physical activity:    Days per week: Not on file    Minutes per session: Not on file  . Stress: Not on file  Relationships  . Social connections:    Talks on phone: Not on file    Gets together: Not on file    Attends religious service: Not on file    Active member of club or organization: Not on  file    Attends meetings of clubs or organizations: Not on file    Relationship status: Not on file  . Intimate partner violence:    Fear of current or ex partner: Not on file    Emotionally abused: Not on file    Physically abused: Not on file    Forced sexual activity: Not on file  Other Topics Concern  . Not on file  Social History Narrative   Patient is right-handed. He lives with his parents in a 2 story house with basement. His bedroom is on the main level. He occasionally drinks tea, avoids caffeine otherwise. He has been unable to work out at the gym due to back and shoulder pain in the last several months.    Review of Systems  Respiratory: Negative for apnea and shortness of breath.    Psychiatric/Behavioral: Positive for sleep disturbance.    Vitals:   11/01/17 1436  BP: 138/88  Pulse: 93  SpO2: 95%     Physical Exam  Constitutional: He is oriented to person, place, and time. He appears well-developed and well-nourished.  HENT:  Head: Normocephalic and atraumatic.  Obese Mallampati 4  Eyes: Pupils are equal, round, and reactive to light. Conjunctivae and EOM are normal. Right eye exhibits no discharge. Left eye exhibits no discharge.  Neck: Normal range of motion. Neck supple. No thyromegaly present.  Cardiovascular: Normal rate and regular rhythm.  Pulmonary/Chest: Effort normal and breath sounds normal. No respiratory distress.  Abdominal: Soft. Bowel sounds are normal. He exhibits no distension. There is no tenderness.  Musculoskeletal: Normal range of motion. He exhibits no edema.  Neurological: He is alert and oriented to person, place, and time. No cranial nerve deficit. Coordination normal.  Skin: Skin is warm and dry. No erythema.  Psychiatric: He has a normal mood and affect.   Assessment:  Excessive daytime sleepiness -Worsening symptoms led to seeking evaluation  History of significant snoring -He has always snored, no significant weight change  Moderate probability of significant sleep disordered breathing -Based on his symptoms and body habitus  Plan/Recommendations:  Order home sleep study  Pathophysiology of sleep disordered breathing discussed  Treatment options discussed with the patient  I will see him back in the office in about 3 months  Encouraged to continue regular exercises and efforts at weight loss   Virl Diamond MD [facility] 11/01/2017, 2:52 PM  CC: Kristian Covey, MD

## 2017-11-01 NOTE — Patient Instructions (Signed)
Daytime sleepiness Moderate to high probability of obstructive sleep apnea  We will order a home sleep study Possibility of treatment with auto titrating CPAP  We will see you in the office in about 3 months We will give you a call as results become available   Sleep Apnea Sleep apnea is a condition in which breathing pauses or becomes shallow during sleep. Episodes of sleep apnea usually last 10 seconds or longer, and they may occur as many as 20 times an hour. Sleep apnea disrupts your sleep and keeps your body from getting the rest that it needs. This condition can increase your risk of certain health problems, including:  Heart attack.  Stroke.  Obesity.  Diabetes.  Heart failure.  Irregular heartbeat.  There are three kinds of sleep apnea:  Obstructive sleep apnea. This kind is caused by a blocked or collapsed airway.  Central sleep apnea. This kind happens when the part of the brain that controls breathing does not send the correct signals to the muscles that control breathing.  Mixed sleep apnea. This is a combination of obstructive and central sleep apnea.  What are the causes? The most common cause of this condition is a collapsed or blocked airway. An airway can collapse or become blocked if:  Your throat muscles are abnormally relaxed.  Your tongue and tonsils are larger than normal.  You are overweight.  Your airway is smaller than normal.  What increases the risk? This condition is more likely to develop in people who:  Are overweight.  Smoke.  Have a smaller than normal airway.  Are elderly.  Are male.  Drink alcohol.  Take sedatives or tranquilizers.  Have a family history of sleep apnea.  What are the signs or symptoms? Symptoms of this condition include:  Trouble staying asleep.  Daytime sleepiness and tiredness.  Irritability.  Loud snoring.  Morning headaches.  Trouble concentrating.  Forgetfulness.  Decreased interest  in sex.  Unexplained sleepiness.  Mood swings.  Personality changes.  Feelings of depression.  Waking up often during the night to urinate.  Dry mouth.  Sore throat.  How is this diagnosed? This condition may be diagnosed with:  A medical history.  A physical exam.  A series of tests that are done while you are sleeping (sleep study). These tests are usually done in a sleep lab, but they may also be done at home.  How is this treated? Treatment for this condition aims to restore normal breathing and to ease symptoms during sleep. It may involve managing health issues that can affect breathing, such as high blood pressure or obesity. Treatment may include:  Sleeping on your side.  Using a decongestant if you have nasal congestion.  Avoiding the use of depressants, including alcohol, sedatives, and narcotics.  Losing weight if you are overweight.  Making changes to your diet.  Quitting smoking.  Using a device to open your airway while you sleep, such as: ? An oral appliance. This is a custom-made mouthpiece that shifts your lower jaw forward. ? A continuous positive airway pressure (CPAP) device. This device delivers oxygen to your airway through a mask. ? A nasal expiratory positive airway pressure (EPAP) device. This device has valves that you put into each nostril. ? A bi-level positive airway pressure (BPAP) device. This device delivers oxygen to your airway through a mask.  Surgery if other treatments do not work. During surgery, excess tissue is removed to create a wider airway.  It is important to  get treatment for sleep apnea. Without treatment, this condition can lead to:  High blood pressure.  Coronary artery disease.  (Men) An inability to achieve or maintain an erection (impotence).  Reduced thinking abilities.  Follow these instructions at home:  Make any lifestyle changes that your health care provider recommends.  Eat a healthy,  well-balanced diet.  Take over-the-counter and prescription medicines only as told by your health care provider.  Avoid using depressants, including alcohol, sedatives, and narcotics.  Take steps to lose weight if you are overweight.  If you were given a device to open your airway while you sleep, use it only as told by your health care provider.  Do not use any tobacco products, such as cigarettes, chewing tobacco, and e-cigarettes. If you need help quitting, ask your health care provider.  Keep all follow-up visits as told by your health care provider. This is important. Contact a health care provider if:  The device that you received to open your airway during sleep is uncomfortable or does not seem to be working.  Your symptoms do not improve.  Your symptoms get worse. Get help right away if:  You develop chest pain.  You develop shortness of breath.  You develop discomfort in your back, arms, or stomach.  You have trouble speaking.  You have weakness on one side of your body.  You have drooping in your face. These symptoms may represent a serious problem that is an emergency. Do not wait to see if the symptoms will go away. Get medical help right away. Call your local emergency services (911 in the [place] Do not drive yourself to the hospital. This information is not intended to replace advice given to you by your health care provider. Make sure you discuss any questions you have with your health care provider. Document Released: 12/10/2001 Document Revised: 08/16/2015 Document Reviewed: 09/29/2014 Elsevier Interactive Patient Education  [employer].

## 2017-12-12 ENCOUNTER — Other Ambulatory Visit: Payer: Self-pay | Admitting: Pulmonary Disease

## 2017-12-12 DIAGNOSIS — G4733 Obstructive sleep apnea (adult) (pediatric): Secondary | ICD-10-CM

## 2017-12-12 DIAGNOSIS — H35413 Lattice degeneration of retina, bilateral: Secondary | ICD-10-CM | POA: Diagnosis not present

## 2017-12-12 DIAGNOSIS — H43812 Vitreous degeneration, left eye: Secondary | ICD-10-CM | POA: Diagnosis not present

## 2017-12-22 DIAGNOSIS — M545 Low back pain: Secondary | ICD-10-CM | POA: Diagnosis not present

## 2018-01-11 DIAGNOSIS — G4733 Obstructive sleep apnea (adult) (pediatric): Secondary | ICD-10-CM | POA: Diagnosis not present

## 2018-01-12 ENCOUNTER — Ambulatory Visit: Payer: Self-pay | Admitting: Neurology

## 2018-01-12 ENCOUNTER — Other Ambulatory Visit: Payer: Self-pay | Admitting: *Deleted

## 2018-01-12 DIAGNOSIS — G4733 Obstructive sleep apnea (adult) (pediatric): Secondary | ICD-10-CM

## 2018-01-16 ENCOUNTER — Telehealth: Payer: Self-pay | Admitting: Pulmonary Disease

## 2018-01-16 DIAGNOSIS — G4733 Obstructive sleep apnea (adult) (pediatric): Secondary | ICD-10-CM

## 2018-01-16 NOTE — Telephone Encounter (Signed)
Dr. Wynona Neat has reviewed the home sleep test this showed Mild sleep apnea.   Recommendations   Treatment options are CPAP with the settings auto 5 to 15.    Weight loss measures .   Advise against driving while sleepy & against medication with sedative side effects.    Make appointment for 3 months for compliance with download with Dr. Wynona Neat.

## 2018-01-17 NOTE — Telephone Encounter (Addendum)
Patient is aware of results and would like to use cpap I have place order and apt nothing further needed.

## 2018-02-04 DIAGNOSIS — J029 Acute pharyngitis, unspecified: Secondary | ICD-10-CM | POA: Diagnosis not present

## 2018-02-09 DIAGNOSIS — M545 Low back pain: Secondary | ICD-10-CM | POA: Diagnosis not present

## 2018-02-19 ENCOUNTER — Encounter: Payer: Self-pay | Admitting: Neurology

## 2018-04-10 ENCOUNTER — Ambulatory Visit: Payer: BLUE CROSS/BLUE SHIELD | Admitting: Family Medicine

## 2018-04-27 ENCOUNTER — Ambulatory Visit: Payer: Self-pay | Admitting: Pulmonary Disease

## 2018-10-17 ENCOUNTER — Other Ambulatory Visit: Payer: Self-pay

## 2018-10-17 ENCOUNTER — Ambulatory Visit (INDEPENDENT_AMBULATORY_CARE_PROVIDER_SITE_OTHER): Payer: BC Managed Care – PPO | Admitting: Family Medicine

## 2018-10-17 VITALS — BP 104/60 | HR 85 | Temp 98.5°F | Ht 65.5 in | Wt 294.8 lb

## 2018-10-17 DIAGNOSIS — R7989 Other specified abnormal findings of blood chemistry: Secondary | ICD-10-CM | POA: Diagnosis not present

## 2018-10-17 DIAGNOSIS — Z23 Encounter for immunization: Secondary | ICD-10-CM

## 2018-10-17 DIAGNOSIS — Z0000 Encounter for general adult medical examination without abnormal findings: Secondary | ICD-10-CM

## 2018-10-17 DIAGNOSIS — G4733 Obstructive sleep apnea (adult) (pediatric): Secondary | ICD-10-CM | POA: Insufficient documentation

## 2018-10-17 NOTE — Progress Notes (Signed)
Subjective:     Patient ID: Jeffery Barrett, male   DOB: 17-Feb-1995, 23 y.o.   MRN: [id number]  HPI   Onalee Hua is seen today for physical exam.  He is a Curator and works on Production manager.  His job has been in a slowdown over the past few months.  He has done a tremendous job with some lifestyle modification.  His weight had increased to 330 pounds last year and currently down to 294.  He has done this mostly through keto type diet and exercise.  He was diagnosed last year with obstructive sleep apnea but never started on CPAP.  He feels fairly well rested during the day.  He had labs last year and had mildly elevated TSH 4.72.  We suggested 68-month follow-up for repeat TSH and free T4 but he did not come back for that. No overt symptoms of hypothyroidism.    Non-smoker.  No regular alcohol.  He states he has had some OCD tendencies.  For example, he states he does things in "threes" such as checking alarms, locks, etc.  Past Medical History:  Diagnosis Date  . Depression   . Headache    No past surgical history on file.  reports that he has quit smoking. His smoking use included e-cigarettes. He has never used smokeless tobacco. He reports current alcohol use. He reports that he does not use drugs. family history includes Arthritis in his paternal grandmother; COPD in his maternal grandfather; Cancer in his maternal grandmother; Depression in his father, paternal grandfather, and sister; Diabetes in his paternal grandmother; Early death in his maternal grandfather; Hearing loss in his father; Heart disease in his paternal grandfather and paternal grandmother; Hypertension in his mother and paternal grandmother; Interstitial cystitis in his sister; Lung cancer in his maternal grandmother; Miscarriages / Stillbirths in his mother; Obstructive Sleep Apnea in his father; Polycystic ovary syndrome in his sister; Scoliosis in his maternal aunt and paternal grandfather; Stroke in his paternal  grandmother; Thyroid disease in his mother. Allergies  Allergen Reactions  . Amoxicillin Hives  . Cephalosporins Hives     Review of Systems  Constitutional: Negative for fatigue.  Eyes: Negative for visual disturbance.  Respiratory: Negative for cough, chest tightness and shortness of breath.   Cardiovascular: Negative for chest pain, palpitations and leg swelling.  Neurological: Negative for dizziness, syncope, weakness, light-headedness and headaches.       Objective:   Physical Exam Constitutional:      General: He is not in acute distress.    Appearance: He is well-developed.  HENT:     Head: Normocephalic and atraumatic.     Right Ear: External ear normal.     Left Ear: External ear normal.  Eyes:     Conjunctiva/sclera: Conjunctivae normal.     Pupils: Pupils are equal, round, and reactive to light.  Neck:     Musculoskeletal: Normal range of motion and neck supple.     Thyroid: No thyromegaly.  Cardiovascular:     Rate and Rhythm: Normal rate and regular rhythm.     Heart sounds: Normal heart sounds. No murmur.  Pulmonary:     Effort: No respiratory distress.     Breath sounds: No wheezing or rales.  Abdominal:     General: Bowel sounds are normal. There is no distension.     Palpations: Abdomen is soft. There is no mass.     Tenderness: There is no abdominal tenderness. There is no guarding or rebound.  Lymphadenopathy:  Cervical: No cervical adenopathy.  Skin:    Findings: No rash.  Neurological:     Mental Status: He is alert and oriented to person, place, and time.     Cranial Nerves: No cranial nerve deficit.     Deep Tendon Reflexes: Reflexes normal.        Assessment:     Physical exam.  He has morbid obesity.  Comorbidity of obstructive sleep apnea currently not treated with CPAP    Plan:     -Recheck TSH and free T4 -Continue weight loss plan -Flu vaccine given -We discussed possible medication options for OCD at this point though he  feels this is not a significant disorder and would like to observe  Eulas Post MD [facility]

## 2018-10-18 LAB — T4, FREE: Free T4: 0.9 ng/dL (ref 0.60–1.60)

## 2018-10-18 LAB — TSH: TSH: 2.17 u[IU]/mL (ref 0.35–4.50)

## 2019-01-07 DIAGNOSIS — H35413 Lattice degeneration of retina, bilateral: Secondary | ICD-10-CM | POA: Diagnosis not present

## 2019-01-07 DIAGNOSIS — H43812 Vitreous degeneration, left eye: Secondary | ICD-10-CM | POA: Diagnosis not present

## 2019-09-26 IMAGING — CT CT HEAD W/O CM
3 series · 15 of 45 positions shown, 18 images · non-contrast
Comparison: None.

CLINICAL DATA: Left occipital headache for 2-3 days.

EXAM:
CT HEAD WITHOUT CONTRAST
TECHNIQUE: Contiguous axial images were obtained from the base of the skull
through the vertex without intravenous contrast.

[Series 2: head 5.0 h37s · axial · 0.40mm/px · z∈[+157,+272]mm · 9 of 28 slices shown, 12 images]
[im 3/28  brain]
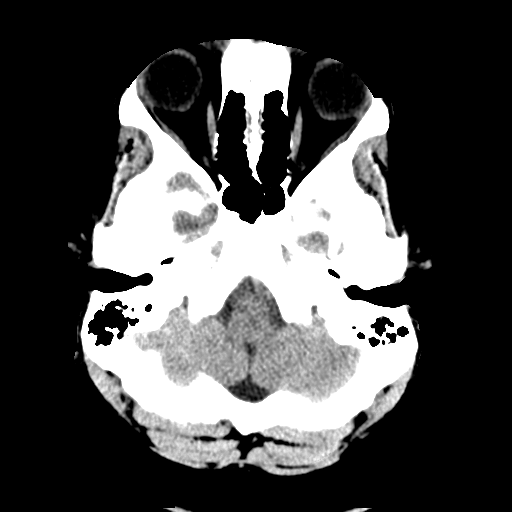
[im 3/28  bone]
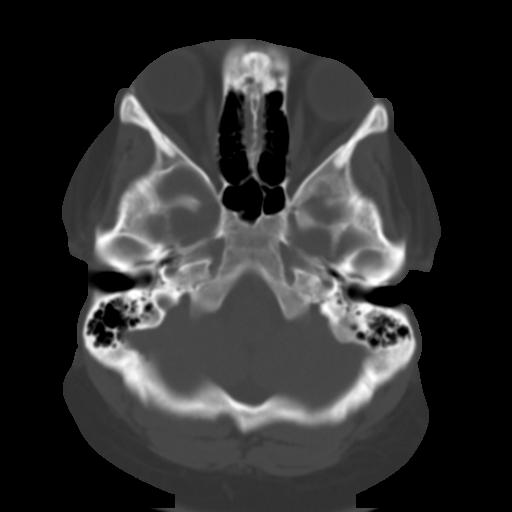
[im 6/28  brain]
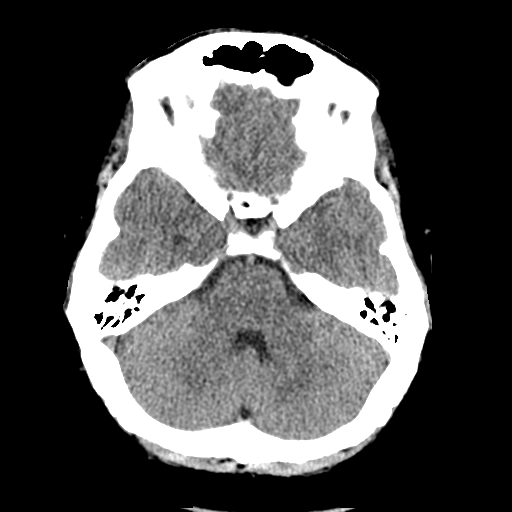
[im 9/28  brain]
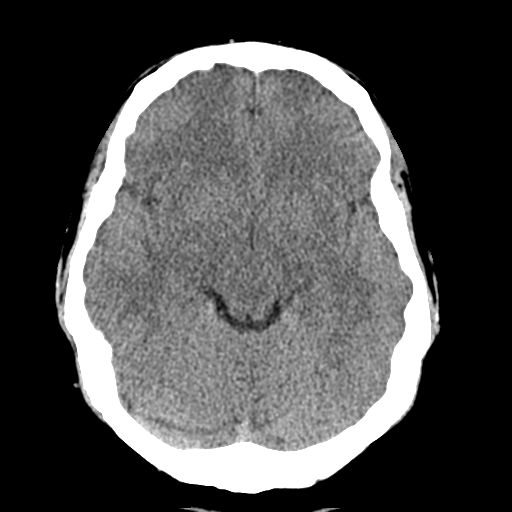
[im 12/28  brain]
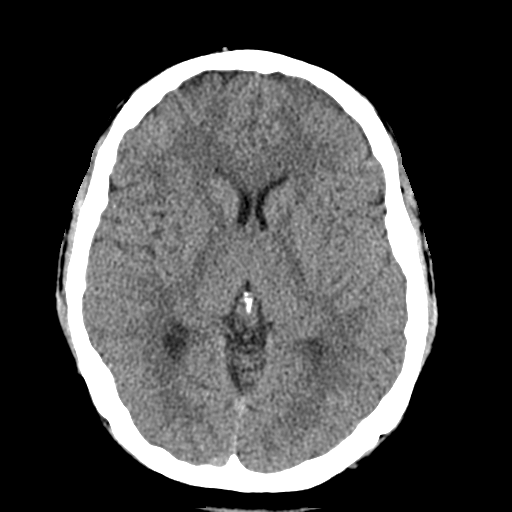
[im 15/28  brain]
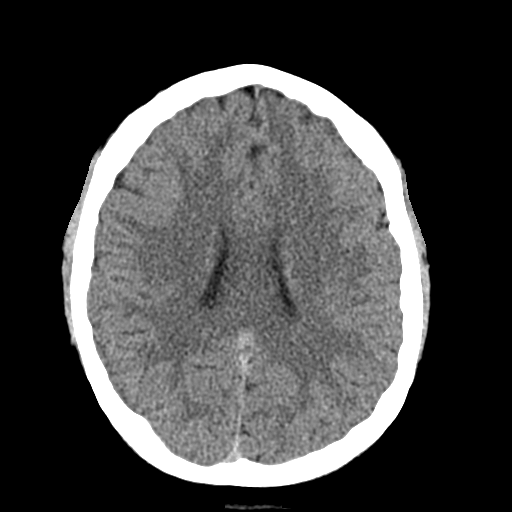
[im 15/28  bone]
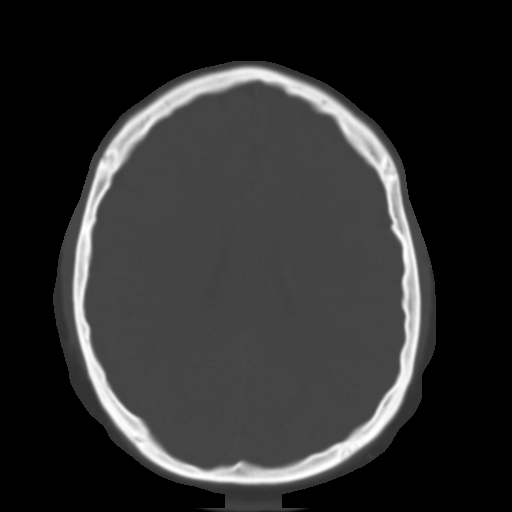
[im 17/28  brain]
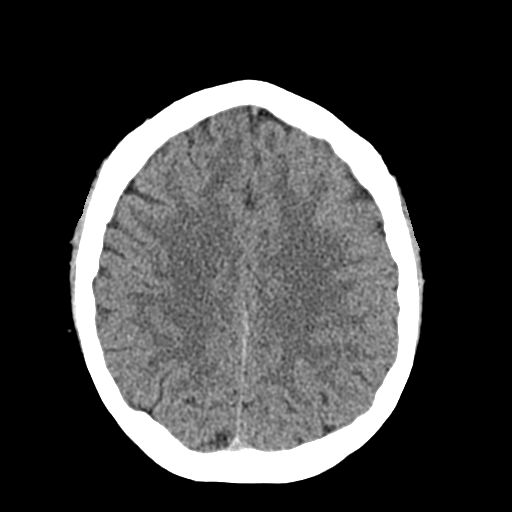
[im 20/28  brain]
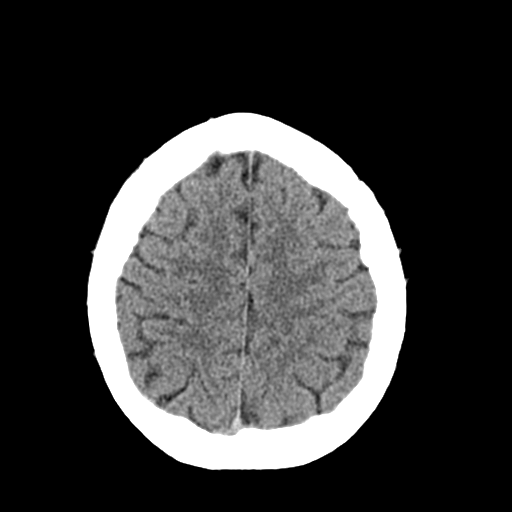
[im 23/28  brain]
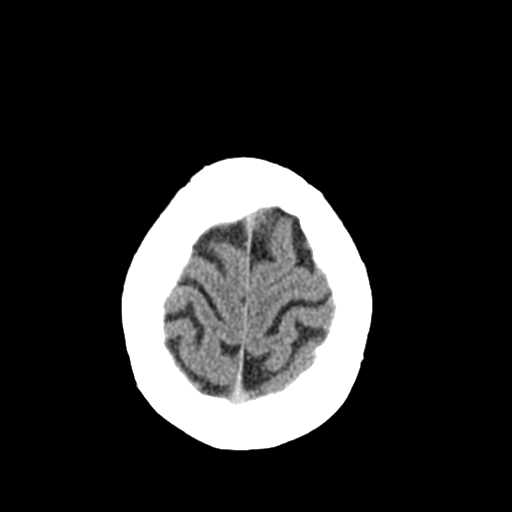
[im 26/28  brain]
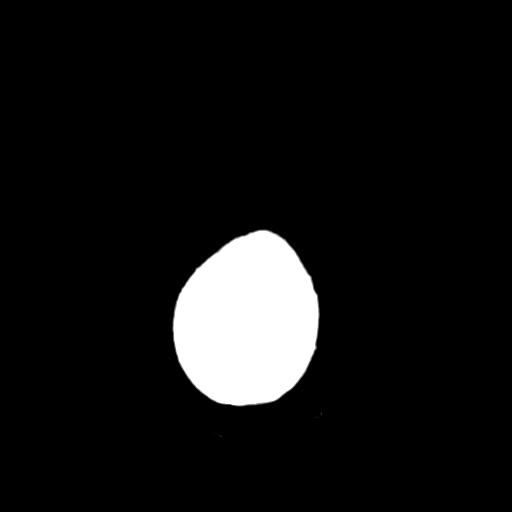
[im 26/28  bone]
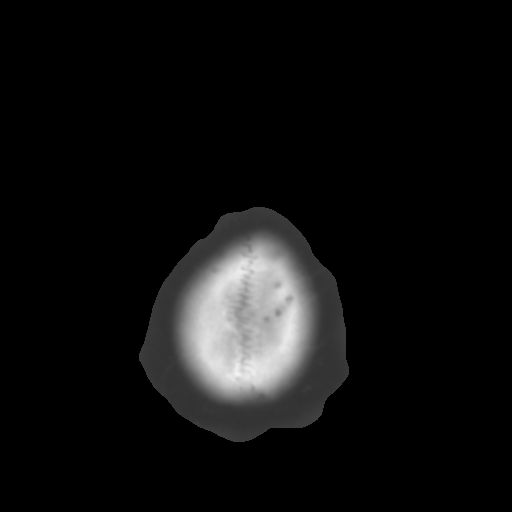

[Series 4: head 3.0 mpr coronal · coronal · 0.29mm/px · 3 of 62 slices shown]
[im 21/62  brain]
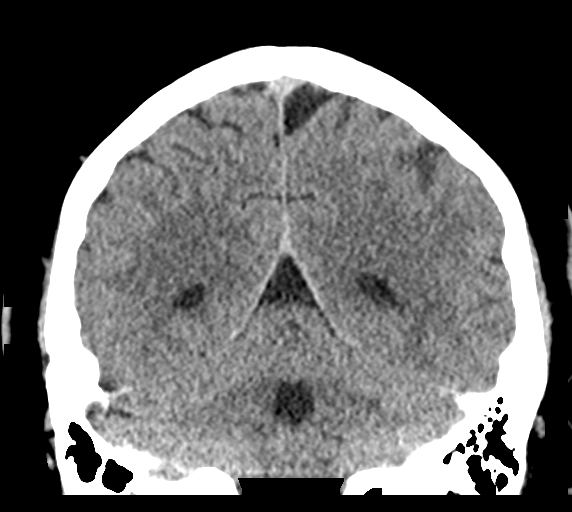
[im 28/62  brain]
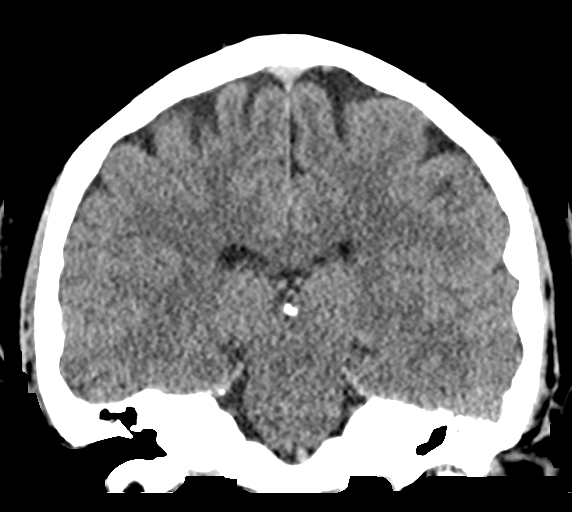
[im 34/62  brain]
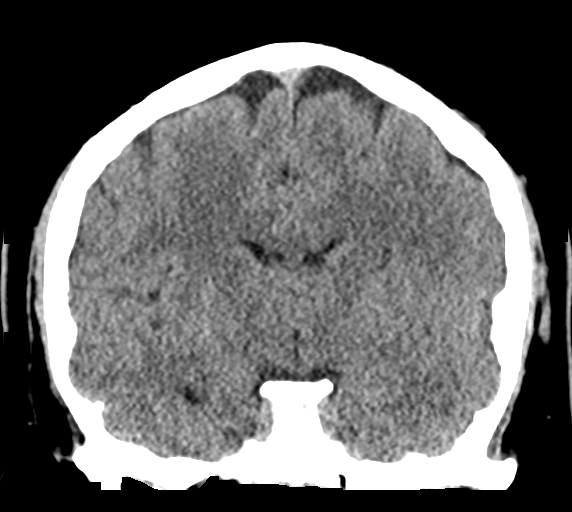

[Series 5: head 3.0 mpr sagittal · sagittal · 0.30mm/px · 3 of 56 slices shown]
[im 19/56  brain]
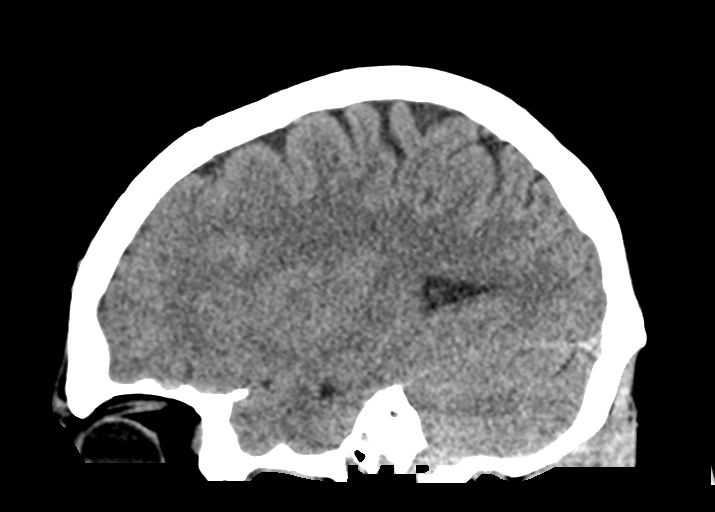
[im 28/56  brain]
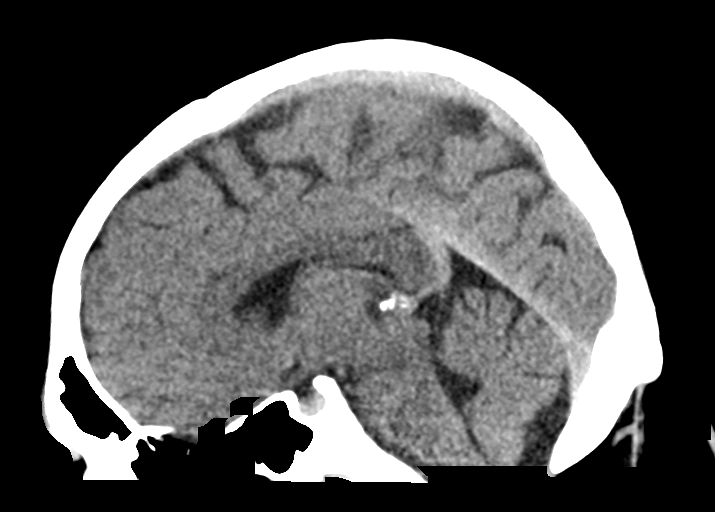
[im 37/56  brain]
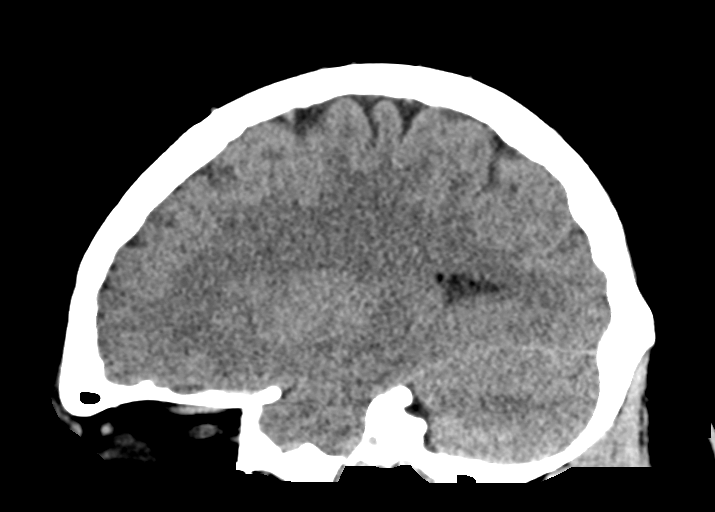

[15 of 45 positions shown; findings below may reference images not displayed]

FINDINGS: Brain: No acute intracranial abnormality. Specifically, no
hemorrhage, hydrocephalus, mass lesion, acute infarction, or
significant intracranial injury.

Vascular: No hyperdense vessel or unexpected calcification.

Skull: No acute calvarial abnormality.

Sinuses/Orbits: Visualized paranasal sinuses and mastoids clear.
Orbital soft tissues unremarkable.

Other: None
IMPRESSION: Normal study.

## 2020-01-08 DIAGNOSIS — H35413 Lattice degeneration of retina, bilateral: Secondary | ICD-10-CM | POA: Diagnosis not present

## 2020-01-08 DIAGNOSIS — H43812 Vitreous degeneration, left eye: Secondary | ICD-10-CM | POA: Diagnosis not present

## 2020-10-19 ENCOUNTER — Encounter: Payer: Self-pay | Admitting: Family Medicine

## 2020-10-19 ENCOUNTER — Ambulatory Visit (INDEPENDENT_AMBULATORY_CARE_PROVIDER_SITE_OTHER): Payer: BC Managed Care – PPO | Admitting: Family Medicine

## 2020-10-19 ENCOUNTER — Other Ambulatory Visit: Payer: Self-pay

## 2020-10-19 DIAGNOSIS — Z0000 Encounter for general adult medical examination without abnormal findings: Secondary | ICD-10-CM | POA: Diagnosis not present

## 2020-10-19 MED ORDER — SERTRALINE HCL 50 MG PO TABS: 50.0000 mg | ORAL_TABLET | Freq: Every day | ORAL | 3 refills | Status: DC

## 2020-10-19 NOTE — Progress Notes (Signed)
Established Patient Office Visit  Subjective:  Patient ID: Jeffery Barrett, male    DOB: [birth year]  Age: 25 y.o. MRN: [id number]  CC:  Chief Complaint  Patient presents with   Annual Exam    HPI Jeffery Barrett presents for physical exam.  He has history of obesity and obstructive sleep apnea.  Currently not using CPAP.  Diagnosed with obstructive apnea but cost of CPAP was detrimental in him getting this set up.  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  Had lost some weight couple years ago but looks like his weight is sort of plateaued out at this time.  His major concern at this time as he has some depressive symptoms.  Frequent symptoms of negativity and low motivation.  Sometimes avoids crowds.  He has had some OCD tendencies in the past.  No suicidal ideation.  He feels like symptoms have progressed somewhat over the past several months.  Would like to explore potential therapies for depression.  Social history-works a full-time Curator.  Has a new dog.  Girlfriend but not seriously dating at this time.  Non-smoker.  Occasional alcohol use but not regularly.  Family history-history of depression father and sister.  Mother has hypertension history and history of hypothyroidism.  Father has obstructive sleep apnea.  Sister has interstitial cystitis history  Past Medical History:  Diagnosis Date   Depression    Headache     History reviewed. No pertinent surgical history.  Family History  Problem Relation Age of Onset   Hypertension Mother    Miscarriages / [place] Mother    Thyroid disease Mother    Depression Father    Hearing loss Father    Obstructive Sleep Apnea Father    Depression Sister    Polycystic ovary syndrome Sister    Interstitial cystitis Sister    Cancer Maternal Grandmother    Lung cancer Maternal Grandmother    COPD Maternal Grandfather    Early death Maternal Grandfather    Arthritis Paternal Grandmother    Heart disease  Paternal Grandmother    Hypertension Paternal Grandmother    Stroke Paternal Grandmother    Diabetes Paternal Grandmother    Depression Paternal Grandfather    Heart disease Paternal Grandfather    Scoliosis Paternal Grandfather    Scoliosis Maternal Aunt     Social History   Socioeconomic History   Marital status: Single    Spouse name: Not on file   Number of children: Not on file   Years of education: Not on file   Highest education level: Associate degree: occupational, Scientist, product/process development, or vocational program  Occupational History   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 He lives with his parents in a 2 story house with basement. His bedroom is on the main level. He occasionally drinks tea, avoids caffeine otherwise. He has been unable to work out at the gym due to back and shoulder pain in the last several months.   Social Determinants of Health   Financial Resource Strain: Not on file  Food  Insecurity: Not on file  Transportation Needs: Not on file  Physical Activity: Not on file  Stress: Not on file  Social Connections: Not on file  Intimate Partner Violence: Not on file    Outpatient Medications Prior to Visit  Medication Sig Dispense Refill   MELATONIN PO Take by mouth at bedtime.     Multiple Vitamins-Minerals (MULTIVITAMIN ADULT PO) Take 1 tablet by mouth daily.     No facility-administered medications prior to visit.    Allergies  Allergen Reactions   Amoxicillin Hives   Cephalosporins Hives    ROS Review of Systems  Constitutional:  Negative for activity change, appetite change, fatigue and fever.   HENT:  Negative for congestion, ear pain and trouble swallowing.   Eyes:  Negative for pain and visual disturbance.  Respiratory:  Negative for cough, shortness of breath and wheezing.   Cardiovascular:  Negative for chest pain and palpitations.  Gastrointestinal:  Negative for abdominal distention, abdominal pain, blood in stool, constipation, diarrhea, nausea, rectal pain and vomiting.  Genitourinary:  Negative for dysuria, hematuria and testicular pain.  Musculoskeletal:  Negative for arthralgias and joint swelling.  Skin:  Negative for rash.  Neurological:  Negative for dizziness, syncope and headaches.  Hematological:  Negative for adenopathy.  Psychiatric/Behavioral:  Positive for dysphoric mood. Negative for agitation, confusion and suicidal ideas. The patient is nervous/anxious.      Objective:    Physical Exam Constitutional:      General: He is not in acute distress.    Appearance: He is well-developed.  HENT:     Head: Normocephalic and atraumatic.     Right Ear: External ear normal.     Left Ear: External ear normal.  Eyes:     Conjunctiva/sclera: Conjunctivae normal.     Pupils: Pupils are equal, round, and reactive to light.  Neck:     Thyroid: No thyromegaly.  Cardiovascular:     Rate and Rhythm: Normal rate and regular rhythm.     Heart sounds: Normal heart sounds. No murmur heard. Pulmonary:     Effort: No respiratory distress.     Breath sounds: No wheezing or rales.  Abdominal:     General: Bowel sounds are normal. There is no distension.     Palpations: Abdomen is soft. There is no mass.     Tenderness: There is no abdominal tenderness. There is no guarding or rebound.  Musculoskeletal:     Cervical back: Normal range of motion and neck supple.  Lymphadenopathy:     Cervical: No cervical adenopathy.  Skin:    Findings: No rash.  Neurological:     Mental Status: He is alert and oriented to person, place, and time.     Cranial Nerves: No cranial nerve  deficit.     Deep Tendon Reflexes: Reflexes normal.  Psychiatric:     Comments: PHQ-9 equals 13    There were no vitals taken for this visit. Wt Readings from Last 3 Encounters:  10/17/18 294 lb 12.8 oz (133.7 kg)  10/06/17 (!) 307 lb 9.6 oz (139.5 kg)  07/28/17 (!) 307 lb (139.3 kg)     Health Maintenance Due  Topic Date Due   HPV VACCINES (1 - Male 2-dose series) Never done       Topic Date Due   HPV VACCINES (1 - Male 2-dose series) Never done    Lab Results  Component Value Date   TSH 2.17 10/17/2018   Lab Results  Component Value Date  WBC 9.4 10/06/2017   HGB 15.1 10/06/2017   HCT 43.9 10/06/2017   MCV 83.2 10/06/2017   PLT 343.0 10/06/2017   Lab Results  Component Value Date   NA 140 10/06/2017   K 4.2 10/06/2017   CO2 27 10/06/2017   GLUCOSE 88 10/06/2017   BUN 16 10/06/2017   CREATININE 1.05 10/06/2017   BILITOT 0.4 10/06/2017   ALKPHOS 32 (L) 10/06/2017   AST 26 10/06/2017   ALT 39 10/06/2017   PROT 8.1 10/06/2017   ALBUMIN 4.7 10/06/2017   CALCIUM 9.8 10/06/2017   GFR 93.09 10/06/2017   Lab Results  Component Value Date   CHOL 159 10/06/2017   Lab Results  Component Value Date   HDL 36.80 (L) 10/06/2017   Lab Results  Component Value Date   LDLCALC 99 10/06/2017   Lab Results  Component Value Date   TRIG 117.0 10/06/2017   Lab Results  Component Value Date   CHOLHDL 4 10/06/2017   No results found for: HGBA1C    Assessment & Plan:   Physical exam/well visit.  We offered flu vaccine but he declines.  Elevated PHQ-9 score.  No suicidal ideation.  We recommend initiating Zoloft 50 mg daily and touch base in 3 to 4 weeks. -We did discuss potential labs.  He is nonfasting today.  He declines at this time. -Tetanus is up-to-date -Have encouraged him to continue weight loss efforts  Meds ordered this encounter  Medications   sertraline (ZOLOFT) 50 MG tablet    Sig: Take 1 tablet (50 mg total) by mouth daily.    Dispense:  90  tablet    Refill:  3    Follow-up: No follow-ups on file.    Evelena Peat, MD

## 2021-01-12 DIAGNOSIS — H35413 Lattice degeneration of retina, bilateral: Secondary | ICD-10-CM | POA: Diagnosis not present

## 2021-01-12 DIAGNOSIS — H43812 Vitreous degeneration, left eye: Secondary | ICD-10-CM | POA: Diagnosis not present

## 2021-03-08 ENCOUNTER — Ambulatory Visit (INDEPENDENT_AMBULATORY_CARE_PROVIDER_SITE_OTHER): Payer: BC Managed Care – PPO | Admitting: Family Medicine

## 2021-03-08 ENCOUNTER — Encounter: Payer: Self-pay | Admitting: Family Medicine

## 2021-03-08 DIAGNOSIS — F4010 Social phobia, unspecified: Secondary | ICD-10-CM

## 2021-03-08 MED ORDER — TIRZEPATIDE 2.5 MG/0.5ML ~~LOC~~ SOAJ: SUBCUTANEOUS | 1 refills | Status: DC

## 2021-03-08 NOTE — Patient Instructions (Signed)
If Mounjaro not covered, see if they will cover Ozempic or Wegovy.    ?

## 2021-03-08 NOTE — Progress Notes (Addendum)
? ?Established Patient Office Visit ? ?Subjective:  ?Patient ID: Jeffery Barrett, male    DOB: [birth year]  Age: 26 y.o. MRN: [id number] ? ?CC:  ?Chief Complaint  ?Patient presents with  ? Medication Consultation  ? ? ?HPI ?Jeffery Barrett presents for follow-up for the following items ? ?History of mixed anxiety and depression.  We started sertraline several months ago.  He states after he first went on this he felt much better in terms of less depression and also less anxiety.  He had some social anxiety type symptoms.  He does relate very stressful past couple weeks.  He had increased job stress and also break-up with girlfriend and he also had fairly close friend that died.  He does have questions regarding possible titration of sertraline versus change of medication.  No suicidal ideation. ? ?He has morbid obesity.  He has been very frustrated with inability to lose weight.  Works fairly long hours.  Has been walking some for exercise and trying to do modified keto type diet without any success.  Weight is up 7 pounds from 4 years ago.  No polyuria or polydipsia.  No known history of diabetes but no recent labs.  Tries to eat relatively lean meals at night.  For example, last night he had grilled chicken with salad and broccoli.  He feels like he has difficult time with appetite control.  Tries to avoid high glycemic foods. ? ?Past Medical History:  ?Diagnosis Date  ? Depression   ? Headache   ? ? ?History reviewed. No pertinent surgical history. ? ?Family History  ?Problem Relation Age of Onset  ? Hypertension Mother   ? Miscarriages / [place] Mother   ? Thyroid disease Mother   ? Depression Father   ? Hearing loss Father   ? Obstructive Sleep Apnea Father   ? Depression Sister   ? Polycystic ovary syndrome Sister   ? Interstitial cystitis Sister   ? Cancer Maternal Grandmother   ? Lung cancer Maternal Grandmother   ? COPD Maternal Grandfather   ? Early death Maternal Grandfather   ? Arthritis  Paternal Grandmother   ? Heart disease Paternal Grandmother   ? Hypertension Paternal Grandmother   ? Stroke Paternal Grandmother   ? Diabetes Paternal Grandmother   ? Depression Paternal Grandfather   ? Heart disease Paternal Grandfather   ? Scoliosis Paternal Grandfather   ? Scoliosis Maternal Aunt   ? ? ?Social History  ? ?Socioeconomic History  ? Marital status: Single  ?  Spouse name: Not on file  ? Number of children: Not on file  ? Years of education: Not on file  ? Highest education level: Associate degree: occupational, Scientist, product/process development, or vocational program  ?Occupational History  ? Occupation: Science writer  ?  Employer: [employer]  ?Tobacco Use  ? Smoking status: Former  ?  Types: E-cigarettes  ? Smokeless tobacco: Never  ?Vaping Use  ? Vaping Use: Former  ?Substance and Sexual Activity  ? Alcohol use: Yes  ?  Comment: social  ? Drug use: No  ? Sexual activity: Not on file  ?Other Topics Concern  ? Not on file  ?Social History Narrative  ? Patient is right-handed. He lives with his parents in a 2 story house with basement. His bedroom is on the main level. He occasionally drinks tea, avoids caffeine otherwise. He has been unable to work out at the gym due to back and shoulder pain in the last several  months.  ? ?Social Determinants of Health  ? ?Financial Resource Strain: Not on file  ?Food Insecurity: Not on file  ?Transportation Needs: Not on file  ?Physical Activity: Not on file  ?Stress: Not on file  ?Social Connections: Not on file  ?Intimate Partner Violence: Not on file  ? ? ?Outpatient Medications Prior to Visit  ?Medication Sig Dispense Refill  ? MELATONIN PO Take by mouth at bedtime.    ? sertraline (ZOLOFT) 50 MG tablet Take 1 tablet (50 mg total) by mouth daily. 90 tablet 3  ? ?No facility-administered medications prior to visit.  ? ? ?Allergies  ?Allergen Reactions  ? Amoxicillin Hives  ? Cephalosporins Hives  ? ?Wt Readings from Last 3 Encounters:  ?03/08/21 (!) 314 lb 6.4 oz  (142.6 kg)  ?10/17/18 294 lb 12.8 oz (133.7 kg)  ?10/06/17 (!) 307 lb 9.6 oz (139.5 kg)  ? ? ? ?ROS ?Review of Systems  ?Constitutional:  Negative for chills and fever.  ?Respiratory:  Negative for cough and shortness of breath.   ?Cardiovascular:  Negative for chest pain.  ?Gastrointestinal:  Negative for abdominal pain.  ?Psychiatric/Behavioral:  Positive for dysphoric mood. Negative for agitation, confusion and suicidal ideas. The patient is nervous/anxious.   ? ?  ?Objective:  ?  ?Physical Exam ?Vitals reviewed.  ?HENT:  ?   Head: Normocephalic.  ?Cardiovascular:  ?   Rate and Rhythm: Normal rate and regular rhythm.  ?Pulmonary:  ?   Effort: Pulmonary effort is normal.  ?   Breath sounds: Normal breath sounds.  ?Neurological:  ?   Mental Status: He is alert.  ?Psychiatric:     ?   Mood and Affect: Mood normal.     ?   Thought Content: Thought content normal.  ? ? ?BP 106/62 (BP Location: Left Arm, Patient Position: Sitting, Cuff Size: Normal)   Pulse (!) 106   Temp 97.6 ?F (36.4 ?C) (Oral)   Ht 5\' 8"  (1.727 m)   Wt (!) 314 lb 6.4 oz (142.6 kg)   SpO2 97%   BMI 47.80 kg/m?  ?Wt Readings from Last 3 Encounters:  ?03/08/21 (!) 314 lb 6.4 oz (142.6 kg)  ?10/17/18 294 lb 12.8 oz (133.7 kg)  ?10/06/17 (!) 307 lb 9.6 oz (139.5 kg)  ? ? ? ?Health Maintenance Due  ?Topic Date Due  ? COVID-19 Vaccine (1) Never done  ? HPV VACCINES (1 - Male 2-dose series) Never done  ? ? ?   ?Topic Date Due  ? HPV VACCINES (1 - Male 2-dose series) Never done  ? ? ?Lab Results  ?Component Value Date  ? TSH 2.17 10/17/2018  ? ?Lab Results  ?Component Value Date  ? WBC 9.4 10/06/2017  ? HGB 15.1 10/06/2017  ? HCT 43.9 10/06/2017  ? MCV 83.2 10/06/2017  ? PLT 343.0 10/06/2017  ? ?Lab Results  ?Component Value Date  ? NA 140 10/06/2017  ? K 4.2 10/06/2017  ? CO2 27 10/06/2017  ? GLUCOSE 88 10/06/2017  ? BUN 16 10/06/2017  ? CREATININE 1.05 10/06/2017  ? BILITOT 0.4 10/06/2017  ? ALKPHOS 32 (L) 10/06/2017  ? AST 26 10/06/2017  ? ALT 39  10/06/2017  ? PROT 8.1 10/06/2017  ? ALBUMIN 4.7 10/06/2017  ? CALCIUM 9.8 10/06/2017  ? GFR 93.09 10/06/2017  ? ?Lab Results  ?Component Value Date  ? CHOL 159 10/06/2017  ? ?Lab Results  ?Component Value Date  ? HDL 36.80 (L) 10/06/2017  ? ?Lab Results  ?Component Value  Date  ? LDLCALC 99 10/06/2017  ? ?Lab Results  ?Component Value Date  ? TRIG 117.0 10/06/2017  ? ?Lab Results  ?Component Value Date  ? CHOLHDL 4 10/06/2017  ? ?No results found for: HGBA1C ? ?  ?Assessment & Plan:  ? ?#1 depression and anxiety symptoms.  He seems to be having predominantly anxiety symptoms with social anxiety.  Currently on sertraline 50 mg daily.  Will titrate up to 100 mg daily.  He will give [place] some feedback in a few weeks.  If not responding at that dosage consider change of medication ? ?#2 morbid obesity.  Patient specifically has interest in weight loss medications.  He has tried lower calorie and modified keto diets along with regular exercise but not having any success with weight loss.  He specifically had questions regarding Mounjaro.  He was not familiar with RJJOAC.  He does not have any history of pancreatitis.  No recent labs.  No family history of medullary thyroid cancer or multiple endocrine neoplasia ? ?-We did discuss possibility of Mounjaro but that insurance coverage may be difficult.  If we can get this covered would start 2.5 mg subcutaneous once weekly for 4 weeks and then if tolerating well titrate to 5 mg once weekly.  Recommend setting up 45-month follow-up.  We also mention the fact that Reginal Lutes is approved for weight loss and that Greggory Keen would work well for this but technically is approved for type 2 diabetes ? ?-We also recommended that he return for fasting labs soon including TSH, hepatic panel, lipid panel, basic metabolic panel, A1c ? ? ?Meds ordered this encounter  ?Medications  ? tirzepatide [facility]) 2.5 MG/0.5ML Pen  ?  Sig: Take 2.5 mg [place] once weekly for 4 weeks and then increase to 5.0 mg   once weekly  ?  Dispense:  2 mL  ?  Refill:  1  ? ? ?Follow-up: No follow-ups on file.  ? ? ?Evelena Peat, MD ?

## 2021-03-09 ENCOUNTER — Other Ambulatory Visit (INDEPENDENT_AMBULATORY_CARE_PROVIDER_SITE_OTHER): Payer: BC Managed Care – PPO

## 2021-03-10 LAB — BASIC METABOLIC PANEL
BUN: 18 mg/dL (ref 6–23)
CO2: 25 mEq/L (ref 19–32)
Calcium: 9.7 mg/dL (ref 8.4–10.5)
Chloride: 101 mEq/L (ref 96–112)
Creatinine, Ser: 0.97 mg/dL (ref 0.40–1.50)
GFR: 107.86 mL/min (ref >60.00)
Glucose, Bld: 82 mg/dL (ref 70–99)
Potassium: 4.1 mEq/L (ref 3.5–5.1)
Sodium: 136 mEq/L (ref 135–145)

## 2021-03-10 LAB — HEPATIC FUNCTION PANEL
ALT: 31 U/L (ref 0–53)
AST: 24 U/L (ref 0–37)
Albumin: 4.7 g/dL (ref 3.5–5.2)
Alkaline Phosphatase: 26 U/L — ABNORMAL LOW (ref 39–117)
Bilirubin, Direct: 0.1 mg/dL (ref 0.0–0.3)
Total Bilirubin: 0.5 mg/dL (ref 0.2–1.2)
Total Protein: 7.7 g/dL (ref 6.0–8.3)

## 2021-03-10 LAB — LIPID PANEL
Cholesterol: 167 mg/dL (ref 0–200)
HDL: 48.4 mg/dL (ref >39.00)
LDL Cholesterol: 97 mg/dL (ref 0–99)
NonHDL: 119.01
Total CHOL/HDL Ratio: 3
Triglycerides: 109 mg/dL (ref 0.0–149.0)
VLDL: 21.8 mg/dL (ref 0.0–40.0)

## 2021-03-10 LAB — HEMOGLOBIN A1C: Hgb A1c MFr Bld: 5.4 % (ref 4.6–6.5)

## 2021-03-10 LAB — TSH: TSH: 2.84 u[IU]/mL (ref 0.35–5.50)

## 2021-03-16 ENCOUNTER — Telehealth: Payer: Self-pay | Admitting: Family Medicine

## 2021-03-16 MED ORDER — WEGOVY 0.25 MG/0.5ML ~~LOC~~ SOAJ: 0.2500 mg | SUBCUTANEOUS | 0 refills | Status: DC

## 2021-03-16 NOTE — Telephone Encounter (Signed)
Patient called back stating that the pharmacy informed him that they were still waiting on approval from insurance. ? ?Please advise. ?

## 2021-03-16 NOTE — Telephone Encounter (Signed)
Pt informed that rx has been sent and 1 month f/u scheduled  ?

## 2021-03-16 NOTE — Telephone Encounter (Signed)
We sent in recent prescription for [facility].  His insurance denied coverage stating they would not cover this in the absence of diabetes.  Patient does wish to try [facility].  We will send in starting dose and see if we can get this covered. ? ?If we are able to get this started make sure he sets up 1 month follow-up to reassess and we will discuss further titration then ?

## 2021-03-17 NOTE — Telephone Encounter (Signed)
Left a message for the pt to return my call. I initiated a PA for the pt's Wegivy 0.25mg /0.71ml the PA has been sent to plan and awaiting decision by pt's insurance. Key- [id number] ?

## 2021-03-25 ENCOUNTER — Telehealth: Payer: Self-pay | Admitting: Family Medicine

## 2021-03-25 NOTE — Telephone Encounter (Signed)
I spoke with the pt and his Wegovy 0.25 has been denied by insurance. Pt would like to know next steps in regards to follow up appt and medication. Please advise ?

## 2021-03-25 NOTE — Telephone Encounter (Signed)
Pt is returning mykal call 

## 2021-03-25 NOTE — Telephone Encounter (Signed)
Patient is requesting a call back from mykal.  He called pt last week and he has some questions about the next appt. ?

## 2021-03-25 NOTE — Telephone Encounter (Signed)
Left a message for the pt to return my call.  

## 2021-03-26 NOTE — Telephone Encounter (Addendum)
I spoke with the pt and he declined referral at this time. Pt stated he would like to hold off on the referral and would like to improve lifestyle on his own.  ?

## 2021-04-06 ENCOUNTER — Telehealth: Payer: Self-pay | Admitting: Family Medicine

## 2021-04-06 NOTE — Telephone Encounter (Signed)
Please refer to 03/08/21 office note.  Patient was told to take  Sertraline 50mg  2 tabs per day.   Patient requesting for prescription for Sertraline 100mg   to be sent to pharmacy. ?

## 2021-04-06 NOTE — Telephone Encounter (Signed)
Pt is calling and was last seen on 03-08-2021 and was told to take 2(50 mg ) =100 mg a day  and just pick up rx for 50 mg #90 pt would like to know if he can have additional #90 or change to 100 mg. Please advise ?

## 2021-04-07 ENCOUNTER — Other Ambulatory Visit: Payer: Self-pay

## 2021-04-07 DIAGNOSIS — F4010 Social phobia, unspecified: Secondary | ICD-10-CM

## 2021-04-07 MED ORDER — SERTRALINE HCL 100 MG PO TABS: 100.0000 mg | ORAL_TABLET | Freq: Every day | ORAL | 3 refills | Status: DC

## 2021-04-07 NOTE — Telephone Encounter (Signed)
Patient notified Sertraline 100mg  sent to [facility] in [place].  ?

## 2021-04-23 ENCOUNTER — Ambulatory Visit: Payer: BC Managed Care – PPO | Admitting: Family Medicine

## 2021-05-03 DIAGNOSIS — J029 Acute pharyngitis, unspecified: Secondary | ICD-10-CM | POA: Diagnosis not present

## 2021-08-03 DIAGNOSIS — Z6841 Body Mass Index (BMI) 40.0 and over, adult: Secondary | ICD-10-CM | POA: Diagnosis not present

## 2021-08-03 DIAGNOSIS — M791 Myalgia, unspecified site: Secondary | ICD-10-CM | POA: Diagnosis not present

## 2021-08-03 DIAGNOSIS — R519 Headache, unspecified: Secondary | ICD-10-CM | POA: Diagnosis not present

## 2021-10-08 ENCOUNTER — Other Ambulatory Visit: Payer: Self-pay | Admitting: Family Medicine

## 2021-10-08 ENCOUNTER — Other Ambulatory Visit: Payer: Self-pay

## 2021-10-22 ENCOUNTER — Ambulatory Visit (INDEPENDENT_AMBULATORY_CARE_PROVIDER_SITE_OTHER): Payer: BC Managed Care – PPO | Admitting: Family Medicine

## 2021-10-22 ENCOUNTER — Encounter: Payer: Self-pay | Admitting: Family Medicine

## 2021-10-22 VITALS — BP 110/70 | HR 82 | Temp 97.7°F | Ht 68.11 in | Wt 326.8 lb

## 2021-10-22 DIAGNOSIS — J351 Hypertrophy of tonsils: Secondary | ICD-10-CM

## 2021-10-22 DIAGNOSIS — Z0000 Encounter for general adult medical examination without abnormal findings: Secondary | ICD-10-CM

## 2021-10-22 MED ORDER — PHENTERMINE HCL 37.5 MG PO CAPS: 37.5000 mg | ORAL_CAPSULE | ORAL | 0 refills | Status: DC

## 2021-10-22 NOTE — Progress Notes (Signed)
Established Patient Office Visit  Subjective   Patient ID: Jeffery Barrett, male    DOB: 02-26-1995  Age: 26 y.o. MRN: [id number]  Chief Complaint  Patient presents with   Annual Exam    HPI   Jeffery Barrett is seen for physical exam.  He actually had several labs done back in March which were fairly unremarkable.  No history of diabetes.  He does have concern regarding his current weight.  He feels like he does not eat out very often and tries to be conscious of what he is eating.  We have tried to get him approved for [facility] but his insurance would not.  He does have comorbidity of obstructive apnea.  He has very large tonsils and without history of recurrent tonsillitis and feels he is having difficulty breathing frequently when he lays back because of his tonsils and even sometimes with swallowing.  He is requesting ENT referral.  He is not consistently using CPAP currently.  He would like to explore appetite suppressant such as phentermine.  He does not have any history of hypertension.  Tetanus up-to-date.  Declines flu vaccine.  Family history strong family history of depression father and sister.  Mother has hypertension and hypothyroidism.  Father has obstructive sleep apnea.  Social history-he works full-time as a Journalist, newspaper.  He has a steady girlfriend.  Non-smoker.  No regular alcohol.  Past Medical History:  Diagnosis Date   Depression    Headache    History reviewed. No pertinent surgical history.  reports that he has quit smoking. His smoking use included e-cigarettes. He has never used smokeless tobacco. He reports current alcohol use. He reports that he does not use drugs. family history includes Arthritis in his paternal grandmother; COPD in his maternal grandfather; Cancer in his maternal grandmother; Depression in his father, paternal grandfather, and sister; Diabetes in his paternal grandmother; Early death in his maternal grandfather; Hearing loss  in his father; Heart disease in his paternal grandfather and paternal grandmother; Hypertension in his mother and paternal grandmother; Interstitial cystitis in his sister; Lung cancer in his maternal grandmother; Miscarriages / Stillbirths in his mother; Obstructive Sleep Apnea in his father; Polycystic ovary syndrome in his sister; Scoliosis in his maternal aunt and paternal grandfather; Stroke in his paternal grandmother; Thyroid disease in his mother. Allergies  Allergen Reactions   Amoxicillin Hives   Cephalosporins Hives    Review of Systems  Constitutional:  Negative for chills, fever and weight loss.  Eyes:  Negative for blurred vision.  Respiratory:  Negative for shortness of breath.   Cardiovascular:  Negative for chest pain.  Gastrointestinal:  Negative for abdominal pain.  Genitourinary:  Negative for dysuria.  Neurological:  Negative for dizziness, weakness and headaches.      Objective:     BP 110/70 (BP Location: Left Arm, Patient Position: Sitting, Cuff Size: Normal)   Pulse 82   Temp 97.7 F (36.5 C) (Oral)   Ht 5' 8.11" (1.73 m)   Wt (!) 326 lb 12.8 oz (148.2 kg)   SpO2 99%   BMI 49.53 kg/m  BP Readings from Last 3 Encounters:  10/22/21 110/70  03/08/21 106/62  10/17/18 104/60   Wt Readings from Last 3 Encounters:  10/22/21 (!) 326 lb 12.8 oz (148.2 kg)  03/08/21 (!) 314 lb 6.4 oz (142.6 kg)  10/17/18 294 lb 12.8 oz (133.7 kg)      Physical Exam Constitutional:      Appearance: Normal appearance.  He is well-developed.  HENT:     Right Ear: External ear normal.     Left Ear: External ear normal.     Mouth/Throat:     Comments: He has large 3+ tonsils with no erythema or exudate. Eyes:     Pupils: Pupils are equal, round, and reactive to light.  Neck:     Thyroid: No thyromegaly.  Cardiovascular:     Rate and Rhythm: Normal rate and regular rhythm.  Pulmonary:     Effort: Pulmonary effort is normal. No respiratory distress.     Breath sounds:  Normal breath sounds. No wheezing or rales.  Abdominal:     Palpations: Abdomen is soft. There is no mass.     Tenderness: There is no abdominal tenderness.  Musculoskeletal:     Cervical back: Neck supple.     Right lower leg: No edema.     Left lower leg: No edema.  Neurological:     Mental Status: He is alert and oriented to person, place, and time.      No results found for any visits on 10/22/21.  Last CBC Lab Results  Component Value Date   WBC 9.4 10/06/2017   HGB 15.1 10/06/2017   HCT 43.9 10/06/2017   MCV 83.2 10/06/2017   RDW 13.3 10/06/2017   PLT 343.0 56/97/9480   Last metabolic panel Lab Results  Component Value Date   GLUCOSE 82 03/09/2021   NA 136 03/09/2021   K 4.1 03/09/2021   CL 101 03/09/2021   CO2 25 03/09/2021   BUN 18 03/09/2021   CREATININE 0.97 03/09/2021   CALCIUM 9.7 03/09/2021   PROT 7.7 03/09/2021   ALBUMIN 4.7 03/09/2021   BILITOT 0.5 03/09/2021   ALKPHOS 26 (L) 03/09/2021   AST 24 03/09/2021   ALT 31 03/09/2021   Last lipids Lab Results  Component Value Date   CHOL 167 03/09/2021   HDL 48.40 03/09/2021   LDLCALC 97 03/09/2021   TRIG 109.0 03/09/2021   CHOLHDL 3 03/09/2021   Last hemoglobin A1c Lab Results  Component Value Date   HGBA1C 5.4 03/09/2021   Last thyroid functions Lab Results  Component Value Date   TSH 2.84 03/09/2021      The ASCVD Risk score (Arnett DK, et al., 2019) failed to calculate for the following reasons:   The 2019 ASCVD risk score is only valid for ages 24 to 79    Assessment & Plan:   Physical exam.  Patient has obesity with comorbidity of obstructive sleep apnea.  He also has large bilateral tonsils and is requesting ENT referral to discuss possible tonsillectomy.  -We discussed strategies for weight loss. -Continue overall calorie restriction and portion control -Agreed to limited trial of phentermine 37.5 mg once daily.  Reviewed potential side effects.  Touch base in 1 month for  feedback. -Set up ENT referral regarding his large tonsils and OSA history -Flu vaccine offered but declined -We elected not to do any labs today since he had multiple labs done back in March which were unremarkable   No follow-ups on file.    Carolann Littler, MD

## 2021-10-22 NOTE — Patient Instructions (Signed)
Give me some feedback in one month regarding the Phentermine.

## 2021-11-23 ENCOUNTER — Telehealth: Payer: Self-pay | Admitting: Family Medicine

## 2021-11-23 NOTE — Telephone Encounter (Signed)
Pt called to request a refill of the: phentermine 37.5 MG capsule   [id number] - [place], [place] - [address] AT [place] [address] Phone: [phone]  Fax: [phone]     LOV:  10/22/21 = CPE

## 2021-11-24 MED ORDER — PHENTERMINE HCL 37.5 MG PO CAPS: 37.5000 mg | ORAL_CAPSULE | ORAL | 1 refills | Status: DC

## 2021-11-24 NOTE — Addendum Note (Signed)
Addended by: Kristian Covey on: 11/24/2021 05:14 AM   Modules accepted: Orders

## 2022-01-04 ENCOUNTER — Encounter: Payer: Self-pay | Admitting: Family Medicine

## 2022-01-04 ENCOUNTER — Ambulatory Visit: Payer: BC Managed Care – PPO | Admitting: Family Medicine

## 2022-01-04 VITALS — BP 110/66 | HR 90 | Temp 97.5°F | Ht 68.11 in | Wt 321.7 lb

## 2022-01-04 DIAGNOSIS — R110 Nausea: Secondary | ICD-10-CM | POA: Diagnosis not present

## 2022-01-04 DIAGNOSIS — R202 Paresthesia of skin: Secondary | ICD-10-CM

## 2022-01-04 DIAGNOSIS — R519 Headache, unspecified: Secondary | ICD-10-CM

## 2022-01-04 NOTE — Patient Instructions (Signed)
Leave OFF the Phentermine  Let me know if you have ANY recurrent headaches over the next two weeks off the Phentermine.

## 2022-01-04 NOTE — Progress Notes (Signed)
Established Patient Office Visit  Subjective   Patient ID: Jeffery Barrett, male    DOB: 03-16-1995  Age: 27 y.o. MRN: [id number]  Chief Complaint  Patient presents with   Migraine    X2 weeks    Eye Problem    X2 weeks   Numbness    Patient complains of numbness, x2 weeks,    Medication Reaction    Patient reports reaction to Phentermine    Nausea    Patient complains of nausea, x2 weeks    HPI   Jeffery Barrett is seen for intermittent headaches past couple weeks.  He has history of possible occipital neuralgia as diagnosed by neurologist back in 2019.  At that time he had normal CT of the head.  He briefly took gabapentin but headaches seem to improve.  He relates left occipital headaches somewhat intermittently past couple weeks with some severe at times.  He had occasional nausea and even a couple episodes of vomiting.  He had some visual issues with what he described as peripheral blurring.  He also related intermittent paresthesias involving right upper and lower extremity.  Denies any recent head injury.  Symptoms seem to improve after rest.  He recently started phentermine for appetite suppression and weight loss and wonders if that may be triggering.  He denies any headache at this time.  No recent confusion.  No speech change.  No facial weakness.  No ataxia.  He had very little recollection of his headaches from back in 2019.  Past Medical History:  Diagnosis Date   Depression    Headache    History reviewed. No pertinent surgical history.  reports that he has quit smoking. His smoking use included e-cigarettes. He has never used smokeless tobacco. He reports current alcohol use. He reports that he does not use drugs. family history includes Arthritis in his paternal grandmother; COPD in his maternal grandfather; Cancer in his maternal grandmother; Depression in his father, paternal grandfather, and sister; Diabetes in his paternal grandmother; Early death in his  maternal grandfather; Hearing loss in his father; Heart disease in his paternal grandfather and paternal grandmother; Hypertension in his mother and paternal grandmother; Interstitial cystitis in his sister; Lung cancer in his maternal grandmother; Miscarriages / Stillbirths in his mother; Obstructive Sleep Apnea in his father; Polycystic ovary syndrome in his sister; Scoliosis in his maternal aunt and paternal grandfather; Stroke in his paternal grandmother; Thyroid disease in his mother. Allergies  Allergen Reactions   Amoxicillin Hives   Cephalosporins Hives    Review of Systems  Constitutional:  Negative for chills, fever and weight loss.  HENT:  Negative for hearing loss.   Eyes:        See HPI  Respiratory:  Negative for shortness of breath.   Cardiovascular:  Negative for chest pain.  Neurological:  Positive for headaches. Negative for dizziness, tremors, sensory change, speech change, focal weakness, seizures and loss of consciousness.      Objective:     BP 110/66 (BP Location: Left Arm, Patient Position: Sitting, Cuff Size: Large)   Pulse 90   Temp (!) 97.5 F (36.4 C) (Oral)   Ht 5' 8.11" (1.73 m)   Wt (!) 321 lb 11.2 oz (145.9 kg)   SpO2 98%   BMI 48.76 kg/m  BP Readings from Last 3 Encounters:  01/04/22 110/66  10/22/21 110/70  03/08/21 106/62   Wt Readings from Last 3 Encounters:  01/04/22 (!) 321 lb 11.2 oz (145.9 kg)  10/22/21 Marland Kitchen)  326 lb 12.8 oz (148.2 kg)  03/08/21 (!) 314 lb 6.4 oz (142.6 kg)      Physical Exam Constitutional:      Appearance: He is well-developed.  HENT:     Right Ear: External ear normal.     Left Ear: External ear normal.  Eyes:     Extraocular Movements: Extraocular movements intact.     Pupils: Pupils are equal, round, and reactive to light.  Neck:     Thyroid: No thyromegaly.  Cardiovascular:     Rate and Rhythm: Normal rate and regular rhythm.     Heart sounds: No murmur heard. Pulmonary:     Effort: Pulmonary effort  is normal. No respiratory distress.     Breath sounds: Normal breath sounds. No wheezing or rales.  Musculoskeletal:     Cervical back: Neck supple.  Neurological:     General: No focal deficit present.     Mental Status: He is alert and oriented to person, place, and time.     Cranial Nerves: No cranial nerve deficit.     Motor: No weakness.     Coordination: Coordination normal.     Gait: Gait normal.  Psychiatric:        Mood and Affect: Mood normal.        Thought Content: Thought content normal.        Judgment: Judgment normal.      No results found for any visits on 01/04/22.    The ASCVD Risk score (Arnett DK, et al., 2019) failed to calculate for the following reasons:   The 2019 ASCVD risk score is only valid for ages 3 to 100    Assessment & Plan:   Patient presents with 2-week history of intermittent atypical left-sided headaches.  He did describe some intermittent visual symptoms and intermittent nausea as well as intermittent paresthesias right upper and lower extremity.  No headache at this time.  Questionable past history of occipital neuralgia.  Query atypical migraine.  Patient thinks these may be related to his phentermine.  -Leave off phentermine -Observe for the next week.  If he has any further recurrence of headache or certainly any recurrent paresthesias of the extremity or visual changes recommend MRI brain for further assessment  Carolann Littler, MD

## 2022-01-04 NOTE — Progress Notes (Signed)
, °

## 2022-01-10 DIAGNOSIS — J351 Hypertrophy of tonsils: Secondary | ICD-10-CM | POA: Diagnosis not present

## 2022-01-10 DIAGNOSIS — G4733 Obstructive sleep apnea (adult) (pediatric): Secondary | ICD-10-CM | POA: Diagnosis not present

## 2022-01-19 DIAGNOSIS — H43812 Vitreous degeneration, left eye: Secondary | ICD-10-CM | POA: Diagnosis not present

## 2022-01-19 DIAGNOSIS — H35413 Lattice degeneration of retina, bilateral: Secondary | ICD-10-CM | POA: Diagnosis not present

## 2022-04-09 ENCOUNTER — Other Ambulatory Visit: Payer: Self-pay | Admitting: Family Medicine

## 2022-04-09 DIAGNOSIS — F4010 Social phobia, unspecified: Secondary | ICD-10-CM

## 2022-08-15 ENCOUNTER — Ambulatory Visit: Payer: BC Managed Care – PPO | Admitting: Family Medicine

## 2022-08-15 ENCOUNTER — Other Ambulatory Visit: Payer: Self-pay | Admitting: Family Medicine

## 2022-08-15 VITALS — BP 114/64 | HR 84 | Temp 98.0°F | Ht 68.11 in | Wt 340.1 lb

## 2022-08-15 DIAGNOSIS — G2581 Restless legs syndrome: Secondary | ICD-10-CM | POA: Diagnosis not present

## 2022-08-15 DIAGNOSIS — J029 Acute pharyngitis, unspecified: Secondary | ICD-10-CM

## 2022-08-15 LAB — POCT RAPID STREP A (OFFICE): Rapid Strep A Screen: NEGATIVE

## 2022-08-15 MED ORDER — PRAMIPEXOLE DIHYDROCHLORIDE 0.25 MG PO TABS: 0.2500 mg | ORAL_TABLET | Freq: Every day | ORAL | 5 refills | Status: DC

## 2022-08-15 MED ORDER — AZITHROMYCIN 250 MG PO TABS: ORAL_TABLET | ORAL | 0 refills | Status: AC

## 2022-08-15 NOTE — Progress Notes (Signed)
Established Patient Office Visit  Subjective   Patient ID: Jeffery Barrett, male    DOB: Jun 05, 1995  Age: 27 y.o. MRN: [id number]  Chief Complaint  Patient presents with   Sore Throat    Pt c/o white spots on tonsil. Sore throat and difficult to swallow. Sx started over a wk ago.     HPI   Jeffery Barrett is seen for the following items  Severe sore throat which started about a week ago.  Only very mild cough and no nasal congestion.  No documented fever.  He noticed some whitish spots on his tonsils.  He has history of very large tonsils.  No dyspnea.  Keeping down fluids.  He is consulted with ENT in the past regarding his tonsils to try to get tonsillectomy but they declined.  He does have history of mild obstructive sleep apnea.  No known sick contacts.  Other issue is restless leg symptoms --at night especially.  His girlfriend has noted that his legs move frequently throughout the night.  No daytime symptoms.  His father also has restless leg symptoms.  [name] does not drink much caffeine.  No regular alcohol.  No history of iron deficiency.  Has hx of OSA (mild from prior study a few years ago).    Past Medical History:  Diagnosis Date   Depression    Headache    No past surgical history on file.  reports that he has quit smoking. His smoking use included e-cigarettes. He has never used smokeless tobacco. He reports current alcohol use. He reports that he does not use drugs. family history includes Arthritis in his paternal grandmother; COPD in his maternal grandfather; Cancer in his maternal grandmother; Depression in his father, paternal grandfather, and sister; Diabetes in his paternal grandmother; Early death in his maternal grandfather; Hearing loss in his father; Heart disease in his paternal grandfather and paternal grandmother; Hypertension in his mother and paternal grandmother; Interstitial cystitis in his sister; Lung cancer in his maternal grandmother; Miscarriages /  Stillbirths in his mother; Obstructive Sleep Apnea in his father; Polycystic ovary syndrome in his sister; Scoliosis in his maternal aunt and paternal grandfather; Stroke in his paternal grandmother; Thyroid disease in his mother. Allergies  Allergen Reactions   Amoxicillin Hives   Cephalosporins Hives    Review of Systems  Constitutional:  Negative for chills and fever.  HENT:  Positive for sore throat. Negative for congestion.   Respiratory:  Positive for cough.   Cardiovascular:  Negative for chest pain.  Neurological:  Negative for tremors, focal weakness, seizures and headaches.      Objective:     BP 114/64 (BP Location: Right Arm, Patient Position: Sitting, Cuff Size: Large)   Pulse 84   Temp 98 F (36.7 C) (Oral)   Ht 5' 8.11" (1.73 m)   Wt (!) 340 lb 1.6 oz (154.3 kg)   SpO2 97%   BMI 51.55 kg/m  BP Readings from Last 3 Encounters:  08/15/22 114/64  01/04/22 110/66  10/22/21 110/70   Wt Readings from Last 3 Encounters:  08/15/22 (!) 340 lb 1.6 oz (154.3 kg)  01/04/22 (!) 321 lb 11.2 oz (145.9 kg)  10/22/21 (!) 326 lb 12.8 oz (148.2 kg)      Physical Exam Vitals reviewed.  Constitutional:      General: He is not in acute distress.    Appearance: He is well-developed. He is not ill-appearing.  HENT:     Right Ear: Tympanic membrane normal.  Left Ear: Tympanic membrane normal.     Mouth/Throat:     Comments: He has symmetrically enlarged tonsils which are erythematous with whitish exudate bilaterally. Neck:     Comments: Mild bilateral anterior cervical adenopathy Cardiovascular:     Rate and Rhythm: Normal rate and regular rhythm.  Pulmonary:     Effort: Pulmonary effort is normal. No respiratory distress.     Breath sounds: Normal breath sounds. No wheezing or rales.  Musculoskeletal:     Cervical back: Neck supple.  Skin:    Findings: No rash.  Neurological:     Mental Status: He is alert.      No results found for any visits on  08/15/22.    The ASCVD Risk score (Arnett DK, et al., 2019) failed to calculate for the following reasons:   The 2019 ASCVD risk score is only valid for ages 28 to 42    Assessment & Plan:   #1 exudative pharyngitis.  Rapid strep negative.  Differential includes viral versus group A strep with false negative rapid screen Patient nontoxic in appearance  -Send throat culture -He has reported allergy to both amoxicillin and cephalosporins.  Treat with Zithromax pending culture results -continue with Advil or Tylenol for symptom relief.   -continue with warm liquids and/or ice for symptom relief.  #2 restless leg symptoms.  Never treated previously.   Continue to avoid regular caffeine.  Consider trial of Mirapex 0.25 mg at night and set up 1 month follow-up to reassess.  Consider ferritin at follow up if no better, but no clinical suspicion of iron deficiency.    No follow-ups on file.    Evelena Peat, MD

## 2022-10-14 ENCOUNTER — Other Ambulatory Visit: Payer: Self-pay | Admitting: Family Medicine

## 2022-10-14 DIAGNOSIS — F4010 Social phobia, unspecified: Secondary | ICD-10-CM

## 2023-08-22 ENCOUNTER — Other Ambulatory Visit: Payer: Self-pay | Admitting: Family Medicine

## 2023-08-24 ENCOUNTER — Other Ambulatory Visit: Payer: Self-pay | Admitting: Family Medicine

## 2023-08-24 DIAGNOSIS — F4010 Social phobia, unspecified: Secondary | ICD-10-CM

## 2023-09-12 ENCOUNTER — Ambulatory Visit (INDEPENDENT_AMBULATORY_CARE_PROVIDER_SITE_OTHER): Payer: Self-pay | Admitting: Family Medicine

## 2023-09-12 ENCOUNTER — Encounter: Payer: Self-pay | Admitting: Family Medicine

## 2023-09-12 VITALS — BP 118/72 | HR 81 | Temp 98.5°F | Ht 68.5 in | Wt 364.5 lb

## 2023-09-12 DIAGNOSIS — Z0000 Encounter for general adult medical examination without abnormal findings: Secondary | ICD-10-CM

## 2023-09-12 DIAGNOSIS — R635 Abnormal weight gain: Secondary | ICD-10-CM

## 2023-09-12 DIAGNOSIS — F4010 Social phobia, unspecified: Secondary | ICD-10-CM

## 2023-09-12 MED ORDER — TIRZEPATIDE-WEIGHT MANAGEMENT 2.5 MG/0.5ML ~~LOC~~ SOLN: 2.5000 mg | SUBCUTANEOUS | 1 refills | Status: DC

## 2023-09-12 NOTE — Patient Instructions (Signed)
 Give me some feedback in one month if able to start the Zepbound 

## 2023-09-12 NOTE — Progress Notes (Signed)
 Established Patient Office Visit  Subjective   Patient ID: Jeffery Barrett, male    DOB: June 25, 1995  Age: 28 y.o. MRN: [id number]  Chief Complaint  Patient presents with   Annual Exam    HPI   Jeffery Barrett is here for physical exam.  He has history of morbid obesity and obstructive sleep apnea.  He had cost issues with CPAP and is currently not treating his OSA.  His weight is up about 25 pounds from August a year ago.  He is very interested in addressing the weight issue.  He states he is currently staying very active with work and also has a new puppy and is doing more walking and trying to eat better.  Usually eats breakfast and lunch but frequently skips dinner.  He is also tried to make good wise choices with his meals and not eating out much and not eating any significant snack foods generally.  He is very surprised that his weight keeps going up.  No peripheral edema issues.  He does have history of some social anxiety and that is currently treated with sertraline  100 mg daily and that seems to be working fairly well.  Also has restless legs and intermittently takes Mirapex  for that.  Health maintenance reviewed:  Health Maintenance  Topic Date Due   HIV Screening  Never done   Hepatitis C Screening  Never done   Hepatitis B Vaccines 19-59 Average Risk (1 of 3 - 19+ 3-dose series) Never done   HPV VACCINES (1 - 3-dose SCDM series) Never done   COVID-19 Vaccine (1 - 2024-25 season) Never done   Influenza Vaccine  04/02/2024 (Originally 08/04/2023)   DTaP/Tdap/Td (2 - Td or Tdap) 09/14/2026   Pneumococcal Vaccine  Aged Out   Meningococcal B Vaccine  Aged Out   - Declines flu vaccine  Social history-single.  He does have a steady girlfriend.  Non-smoker.  No regular alcohol use.  Works as a Curator  Family history significant for father having depression as well as sister.  Mom with hypertension and hypothyroidism.  Father has obstructive sleep apnea.  Past Medical  History:  Diagnosis Date   Depression    Headache    History reviewed. No pertinent surgical history.  reports that he has quit smoking. His smoking use included e-cigarettes. He has never used smokeless tobacco. He reports current alcohol use. He reports that he does not use drugs. family history includes Arthritis in his paternal grandmother; COPD in his maternal grandfather; Cancer in his maternal grandmother; Depression in his father, paternal grandfather, and sister; Diabetes in his paternal grandmother; Early death in his maternal grandfather; Hearing loss in his father; Heart disease in his paternal grandfather and paternal grandmother; Hypertension in his mother and paternal grandmother; Interstitial cystitis in his sister; Lung cancer in his maternal grandmother; Miscarriages / Stillbirths in his mother; Obstructive Sleep Apnea in his father; Polycystic ovary syndrome in his sister; Scoliosis in his maternal aunt and paternal grandfather; Stroke in his paternal grandmother; Thyroid  disease in his mother. Allergies  Allergen Reactions   Amoxicillin Hives   Cephalosporins Hives    Review of Systems  Constitutional:  Negative for malaise/fatigue.  Eyes:  Negative for blurred vision.  Respiratory:  Negative for shortness of breath.   Cardiovascular:  Negative for chest pain.  Gastrointestinal:  Negative for abdominal pain.  Genitourinary:  Negative for dysuria.  Neurological:  Negative for dizziness, weakness and headaches.      Objective:  BP 118/72   Pulse 81   Temp 98.5 F (36.9 C) (Oral)   Ht 5' 8.5 (1.74 m)   Wt (!) 364 lb 8 oz (165.3 kg)   SpO2 97%   BMI 54.61 kg/m  BP Readings from Last 3 Encounters:  09/12/23 118/72  08/15/22 114/64  01/04/22 110/66   Wt Readings from Last 3 Encounters:  09/12/23 (!) 364 lb 8 oz (165.3 kg)  08/15/22 (!) 340 lb 1.6 oz (154.3 kg)  01/04/22 (!) 321 lb 11.2 oz (145.9 kg)      Physical Exam Vitals reviewed.   Constitutional:      General: He is not in acute distress.    Appearance: He is well-developed.  HENT:     Right Ear: External ear normal.     Left Ear: External ear normal.  Eyes:     Pupils: Pupils are equal, round, and reactive to light.  Neck:     Thyroid : No thyromegaly.  Cardiovascular:     Rate and Rhythm: Normal rate and regular rhythm.  Pulmonary:     Effort: Pulmonary effort is normal. No respiratory distress.     Breath sounds: Normal breath sounds. No wheezing or rales.  Abdominal:     Palpations: Abdomen is soft.     Tenderness: There is no abdominal tenderness.  Musculoskeletal:     Cervical back: Neck supple.  Neurological:     Mental Status: He is alert and oriented to person, place, and time.      No results found for any visits on 09/12/23.    The ASCVD Risk score (Arnett DK, et al., 2019) failed to calculate for the following reasons:   The 2019 ASCVD risk score is only valid for ages 57 to 1    Assessment & Plan:   Problem List Items Addressed This Visit   None Visit Diagnoses       Physical exam    -  Primary   Relevant Orders   Basic metabolic panel with GFR   Lipid panel   CBC with Differential/Platelet   Hepatic function panel     Weight gain       Relevant Orders   TSH   Hemoglobin A27c     28 year old male with history of morbid obesity.  He has comorbidities including obstructive sleep apnea.  Current BMI over 54.  -Discussed vaccines including flu vaccine and he declines - Obtain follow-up labs as above - He has struggled with weight loss with lower calorie diets and increasing activity levels.  Currently does not consume any significant calories through beverages.  Has eliminated sodas and any sugar containing drinks completely.  Has still struggled tremendously with weight and in fact has gained 25 pounds in spite of the above.  He is very interested in possible GLP-1 medication options.  He has no history of pancreatitis and no  family history of thyroid  cancer.  We discussed initiation of Zepbound  2.5 mg once weekly if we get this covered.  Give feedback in 1 month.  -Also discussed our concerns regarding his untreated obstructive sleep apnea.  He had difficulties getting CPAP covered.  Hopefully, with significant weight loss this can improve his well  No follow-ups on file.    Wolm Scarlet, MD

## 2023-09-13 ENCOUNTER — Ambulatory Visit: Payer: Self-pay | Admitting: Family Medicine

## 2023-09-13 LAB — CBC WITH DIFFERENTIAL/PLATELET
Basophils Absolute: 0.1 K/uL (ref 0.0–0.1)
Basophils Relative: 0.9 % (ref 0.0–3.0)
Eosinophils Absolute: 0.3 K/uL (ref 0.0–0.7)
Eosinophils Relative: 3 % (ref 0.0–5.0)
HCT: 43.9 % (ref 39.0–52.0)
Hemoglobin: 14.6 g/dL (ref 13.0–17.0)
Lymphocytes Relative: 22.8 % (ref 12.0–46.0)
Lymphs Abs: 2.3 K/uL (ref 0.7–4.0)
MCHC: 33.3 g/dL (ref 30.0–36.0)
MCV: 83.4 fl (ref 78.0–100.0)
Monocytes Absolute: 0.8 K/uL (ref 0.1–1.0)
Monocytes Relative: 7.6 % (ref 3.0–12.0)
Neutro Abs: 6.6 K/uL (ref 1.4–7.7)
Neutrophils Relative %: 65.7 % (ref 43.0–77.0)
Platelets: 358 K/uL (ref 150.0–400.0)
RBC: 5.27 Mil/uL (ref 4.22–5.81)
RDW: 14.2 % (ref 11.5–15.5)
WBC: 10 K/uL (ref 4.0–10.5)

## 2023-09-13 LAB — LIPID PANEL
Cholesterol: 178 mg/dL (ref 0–200)
HDL: 40.5 mg/dL (ref >39.00)
LDL Cholesterol: 115 mg/dL — ABNORMAL HIGH (ref 0–99)
NonHDL: 137.44
Total CHOL/HDL Ratio: 4
Triglycerides: 111 mg/dL (ref 0.0–149.0)
VLDL: 22.2 mg/dL (ref 0.0–40.0)

## 2023-09-13 LAB — BASIC METABOLIC PANEL WITH GFR
BUN: 16 mg/dL (ref 6–23)
CO2: 24 meq/L (ref 19–32)
Calcium: 9.5 mg/dL (ref 8.4–10.5)
Chloride: 102 meq/L (ref 96–112)
Creatinine, Ser: 1.04 mg/dL (ref 0.40–1.50)
GFR: 97.48 mL/min (ref >60.00)
Glucose, Bld: 92 mg/dL (ref 70–99)
Potassium: 4.2 meq/L (ref 3.5–5.1)
Sodium: 135 meq/L (ref 135–145)

## 2023-09-13 LAB — HEPATIC FUNCTION PANEL
ALT: 35 U/L (ref 0–53)
AST: 24 U/L (ref 0–37)
Albumin: 4.6 g/dL (ref 3.5–5.2)
Alkaline Phosphatase: 29 U/L — ABNORMAL LOW (ref 39–117)
Bilirubin, Direct: 0 mg/dL (ref 0.0–0.3)
Total Bilirubin: 0.3 mg/dL (ref 0.2–1.2)
Total Protein: 8.1 g/dL (ref 6.0–8.3)

## 2023-09-13 LAB — TSH: TSH: 4.98 u[IU]/mL (ref 0.35–5.50)

## 2023-09-13 LAB — HEMOGLOBIN A1C: Hgb A1c MFr Bld: 6 % (ref 4.6–6.5)

## 2023-09-14 ENCOUNTER — Telehealth: Payer: Self-pay | Admitting: *Deleted

## 2023-09-14 ENCOUNTER — Telehealth: Payer: Self-pay

## 2023-09-14 MED ORDER — TIRZEPATIDE 2.5 MG/0.5ML ~~LOC~~ SOAJ: 2.5000 mg | SUBCUTANEOUS | 0 refills | Status: DC

## 2023-09-14 NOTE — Addendum Note (Signed)
 Addended by: METTA KRISTEN CROME on: 09/14/2023 01:42 PM   Modules accepted: Orders

## 2023-09-14 NOTE — Telephone Encounter (Signed)
 Copied from CRM #[id number]. Topic: Clinical - Medication Question >> Sep 14, 2023  2:38 PM Jeffery Barrett wrote: Reason for CRM: Pt states that the insurance does not cover either the Mounjaro  or the Zepbound  and wants to know what the next step would be. States his insurance would not cover anything like this until he is actually a diabetic. Would like to know what else can be done.   Pt callback [phone]

## 2023-09-14 NOTE — Telephone Encounter (Signed)
 Ozempic Brena is approved exclusively as an adjunct to diet and exercise to improve glycemic control in adults with type 2 diabetes mellitus. A review of patient's medical chart reveals no documented diagnosis of type 2 diabetes or an A1C indicative of diabetes. Therefore, they do not currently meet the criteria for prior authorization of this medication. If clinically appropriate, alternative  options such as Saxenda, Zepbound, or Wegovy  may be considered for this patient.

## 2023-09-15 MED ORDER — WEGOVY 0.25 MG/0.5ML ~~LOC~~ SOAJ: 0.2500 mg | SUBCUTANEOUS | 0 refills | Status: DC

## 2023-09-15 NOTE — Telephone Encounter (Signed)
 Patient informed and rx sent.

## 2023-09-15 NOTE — Addendum Note (Signed)
 Addended by: METTA KRISTEN CROME on: 09/15/2023 01:50 PM   Modules accepted: Orders

## 2023-09-18 ENCOUNTER — Telehealth: Payer: Self-pay

## 2023-09-18 NOTE — Telephone Encounter (Signed)
 Please see previous note.

## 2023-09-18 NOTE — Telephone Encounter (Signed)
 Copied from CRM #[id number]. Topic: Clinical - Medication Question >> Sep 14, 2023  2:38 PM Berneda FALCON wrote: Reason for CRM: Pt states that the insurance does not cover either the Mounjaro  or the Zepbound  and wants to know what the next step would be. States his insurance would not cover anything like this until he is actually a diabetic. Would like to know what else can be done.   Pt callback is-[phone] >> Sep 15, 2023  3:24 PM Drema MATSU wrote: Patient insurance denied WEGOVY  as well and it will cost $1200.

## 2023-09-24 ENCOUNTER — Other Ambulatory Visit: Payer: Self-pay | Admitting: Family Medicine

## 2023-09-24 DIAGNOSIS — F4010 Social phobia, unspecified: Secondary | ICD-10-CM

## 2023-11-21 ENCOUNTER — Ambulatory Visit: Payer: Self-pay | Admitting: Family Medicine

## 2023-11-21 ENCOUNTER — Ambulatory Visit: Payer: Self-pay

## 2023-11-21 ENCOUNTER — Encounter: Payer: Self-pay | Admitting: Family Medicine

## 2023-11-21 VITALS — BP 110/60 | HR 87 | Temp 97.6°F | Wt 362.4 lb

## 2023-11-21 DIAGNOSIS — G5603 Carpal tunnel syndrome, bilateral upper limbs: Secondary | ICD-10-CM | POA: Diagnosis not present

## 2023-11-21 DIAGNOSIS — R400 Somnolence: Secondary | ICD-10-CM | POA: Diagnosis not present

## 2023-11-21 DIAGNOSIS — F4010 Social phobia, unspecified: Secondary | ICD-10-CM | POA: Diagnosis not present

## 2023-11-21 DIAGNOSIS — R079 Chest pain, unspecified: Secondary | ICD-10-CM

## 2023-11-21 NOTE — Patient Instructions (Signed)
 Hold the Sertraline  for now  Follow up for any exertional chest pain or other concerns.

## 2023-11-21 NOTE — Telephone Encounter (Signed)
 Noted

## 2023-11-21 NOTE — Progress Notes (Signed)
 Established Patient Office Visit  Subjective   Patient ID: Jeffery Barrett, male    DOB: Jul 02, 1995  Age: 28 y.o. MRN: [id number]  Chief Complaint  Patient presents with   Chest Pain    HPI   Jeffery Barrett has history of morbid obesity, obstructive sleep apnea, social anxiety disorder.  Seen today for several issues as follows  Chest pain.  States has had about 1 month of frequent episodes of chest pain which usually last all day .  He feels a tightness in the middle of the chest.  No dyspnea or diaphoresis.  No nausea or vomiting.  No active GERD symptoms.  Denies any cough.  Occasional burning quality.  No exertional discomfort.  He frequently goes deer hunting and [place] and carries fairly heavy gun with him and has never had any exertional symptoms whatsoever with that.  He wonders if this somehow may be related to his sertraline .  He held this for the past few days has had no symptoms whatsoever.  We explained that chest pain is certainly not a frequent reported symptom of SSRIs.  He has not noted any increase in his anxiety symptoms since holding the sertraline  and he prefers to leave this off at this time.  He was taking this for social anxiety disorder.  Second issue is intermittent thumb tingling sensation especially at night with occasional associated pain.  Works as a curator.  Suspects he may have some carpal tunnel syndrome.  No definite weakness.  Symptoms are bilateral.  Third issue is progressive daytime fatigue and daytime somnolence.  Had sleep study 2019 which was a home study which showed AHI of 17.3/h.  Recent TSH normal.  He wonders if he may have had some progressive sleep apnea since then.  Past Medical History:  Diagnosis Date   Depression    Headache    History reviewed. No pertinent surgical history.  reports that he has quit smoking. His smoking use included e-cigarettes. He has never used smokeless tobacco. He reports current  alcohol use. He reports that he does not use drugs. family history includes Arthritis in his paternal grandmother; COPD in his maternal grandfather; Cancer in his maternal grandmother; Depression in his father, paternal grandfather, and sister; Diabetes in his paternal grandmother; Early death in his maternal grandfather; Hearing loss in his father; Heart disease in his paternal grandfather and paternal grandmother; Hypertension in his mother and paternal grandmother; Interstitial cystitis in his sister; Lung cancer in his maternal grandmother; Miscarriages / Stillbirths in his mother; Obstructive Sleep Apnea in his father; Polycystic ovary syndrome in his sister; Scoliosis in his maternal aunt and paternal grandfather; Stroke in his paternal grandmother; Thyroid  disease in his mother. Allergies  Allergen Reactions   Amoxicillin Hives   Cephalosporins Hives    Review of Systems  Constitutional:  Positive for malaise/fatigue. Negative for chills, fever and weight loss.  Respiratory:  Negative for cough, hemoptysis, sputum production, shortness of breath and wheezing.   Cardiovascular:  Positive for chest pain. Negative for palpitations, orthopnea, claudication, leg swelling and PND.  Gastrointestinal:  Negative for abdominal pain, heartburn, nausea and vomiting.  Neurological:  Negative for focal weakness.  Psychiatric/Behavioral:  Negative for depression.       Objective:     BP 110/60   Pulse 87   Temp 97.6 F (36.4 C) (Oral)   Wt (!) 362 lb 6.4 oz (164.4 kg)   SpO2 98%   BMI 54.29 kg/m  BP Readings  from Last 3 Encounters:  11/21/23 110/60  09/12/23 118/72  08/15/22 114/64   Wt Readings from Last 3 Encounters:  11/21/23 (!) 362 lb 6.4 oz (164.4 kg)  09/12/23 (!) 364 lb 8 oz (165.3 kg)  08/15/22 (!) 340 lb 1.6 oz (154.3 kg)      Physical Exam Vitals reviewed.  Constitutional:      Appearance: He is well-developed.  HENT:     Right Ear: External ear normal.     Left Ear:  External ear normal.  Eyes:     Pupils: Pupils are equal, round, and reactive to light.  Neck:     Thyroid : No thyromegaly.  Cardiovascular:     Rate and Rhythm: Normal rate and regular rhythm.     Heart sounds: Normal heart sounds. No murmur heard. Pulmonary:     Effort: Pulmonary effort is normal. No respiratory distress.     Breath sounds: Normal breath sounds. No wheezing or rales.  Chest:     Comments: Chest well is nontender to palpation Musculoskeletal:     Cervical back: Neck supple.  Neurological:     Mental Status: He is alert and oriented to person, place, and time.      No results found for any visits on 11/21/23.  Last CBC Lab Results  Component Value Date   WBC 10.0 09/12/2023   HGB 14.6 09/12/2023   HCT 43.9 09/12/2023   MCV 83.4 09/12/2023   RDW 14.2 09/12/2023   PLT 358.0 09/12/2023   Last metabolic panel Lab Results  Component Value Date   GLUCOSE 92 09/12/2023   NA 135 09/12/2023   K 4.2 09/12/2023   CL 102 09/12/2023   CO2 24 09/12/2023   BUN 16 09/12/2023   CREATININE 1.04 09/12/2023   GFR 97.48 09/12/2023   CALCIUM 9.5 09/12/2023   PROT 8.1 09/12/2023   ALBUMIN 4.6 09/12/2023   BILITOT 0.3 09/12/2023   ALKPHOS 29 (L) 09/12/2023   AST 24 09/12/2023   ALT 35 09/12/2023   Last lipids Lab Results  Component Value Date   CHOL 178 09/12/2023   HDL 40.50 09/12/2023   LDLCALC 115 (H) 09/12/2023   TRIG 111.0 09/12/2023   CHOLHDL 4 09/12/2023   Last hemoglobin A1c Lab Results  Component Value Date   HGBA1C 6.0 09/12/2023   Last thyroid  functions Lab Results  Component Value Date   TSH 4.98 09/12/2023   FREET4 0.90 10/17/2018      The ASCVD Risk score (Arnett DK, et al., 2019) failed to calculate for the following reasons:   The 2019 ASCVD risk score is only valid for ages 18 to 40    Assessment & Plan:   #1 atypical chest pain.  He had 1 month history of symptoms that lasted most of the day and are never exertional.  Very  atypical features.  EKG today shows sinus rhythm with no acute changes.  Patient thinks symptoms may be related to sertraline .  Interestingly, he has not had any symptoms since holding sertraline  a few days ago.  Continue to hold sertraline  for now.  Follow-up immediately for any exertional symptoms or other concerns  #2 probable bilateral carpal tunnel syndrome.  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  Recommend nighttime use of wrist splints and be in touch if symptoms not resolving a couple weeks  #3 history of obstructive sleep apnea.  Patient currently not using CPAP.  Had some recent progressive daytime somnolence and fatigue.  Set up referral with  pulmonary.  #4 history of social anxiety.  Has been on sertraline  for quite some time.  Patient prefers to leave this off and will be in touch if he has any increased anxiety symptoms  No follow-ups on file.    Wolm Scarlet, MD

## 2023-11-21 NOTE — Telephone Encounter (Signed)
 FYI Only or Action Required?: FYI only for provider: appointment scheduled on 11/21/2023.  Patient was last seen in primary care on 09/12/2023 by Micheal Wolm ORN, MD.  Called Nurse Triage reporting Chest Pain.  Symptoms began month.  Interventions attempted: Other: pt stopped taking anxiety medication.  Symptoms are: comes and goes: none at present time.  Triage Disposition: See Physician Within 24 Hours  Patient/caregiver understands and will follow disposition?: Yes   Copied from CRM [id number]. Topic: Clinical - Red Word Triage >> Nov 21, 2023  8:03 AM Carlyon D wrote: Red Word that prompted transfer to Nurse Triage: having reactions to his medication states his left side arm and chest becomes painful/tight. Pt wants appt with pcp to discuss. Pt is not 100% sure if its the meds but believes it is Reason for Disposition  [1] Chest pain lasts > 5 minutes AND [2] occurred > 3 days ago (72 hours) AND [3] NO chest pain or cardiac symptoms now  Answer Assessment - Initial Assessment Questions 1. LOCATION: Where does it hurt?       Left chest and left arm 2. RADIATION: Does the pain go anywhere else? (e.g., into neck, jaw, arms, back)     Left arm 3. ONSET: When did the chest pain begin? (Minutes, hours or days)      X month 4. PATTERN: Does the pain come and go, or has it been constant since it started?  Does it get worse with exertion?      Constant  - comes and goes depends on if pt takes medication or not 5. DURATION: How long does it last (e.g., seconds, minutes, hours)     na 6. SEVERITY: How bad is the pain?  (e.g., Scale 1-10; mild, moderate, or severe)     moderate 7. CARDIAC RISK FACTORS: Do you have any history of heart problems or risk factors for heart disease? (e.g., angina, prior heart attack; diabetes, high blood pressure, high cholesterol, smoker, or strong family history of heart disease)     na 8. PULMONARY RISK FACTORS: Do you have any history of  lung disease?  (e.g., blood clots in lung, asthma, emphysema, birth control pills)     na 9. CAUSE: What do you think is causing the chest pain?     medication 10. OTHER SYMPTOMS: Do you have any other symptoms? (e.g., dizziness, nausea, vomiting, sweating, fever, difficulty breathing, cough)       no 11. PREGNANCY: Is there any chance you are pregnant? When was your last menstrual period?       na  Notice left chest and left arm pain & tightness when taking sertraline  for anxiety and pramipexole  (MIRAPEX ) 0.25 MG tablet for restless leg.  Pt stated he has experimented and stopped taking medications and when he does not take the medication the chest/arm pain and tightness goes away.  Not taking weight management prescriptions due to insurance not covering.  Not taking melatonin.  Protocols used: Chest Pain-A-AH

## 2024-01-15 ENCOUNTER — Encounter: Payer: Self-pay | Admitting: Pulmonary Disease

## 2024-01-15 ENCOUNTER — Ambulatory Visit: Payer: PRIVATE HEALTH INSURANCE | Admitting: Pulmonary Disease

## 2024-01-15 VITALS — BP 110/70 | HR 91 | Ht 68.0 in | Wt 359.0 lb

## 2024-01-15 DIAGNOSIS — G4733 Obstructive sleep apnea (adult) (pediatric): Secondary | ICD-10-CM

## 2024-01-15 DIAGNOSIS — G2581 Restless legs syndrome: Secondary | ICD-10-CM | POA: Insufficient documentation

## 2024-01-15 DIAGNOSIS — Z6841 Body Mass Index (BMI) 40.0 and over, adult: Secondary | ICD-10-CM | POA: Insufficient documentation

## 2024-01-15 MED ORDER — MODAFINIL 200 MG PO TABS: 200.0000 mg | ORAL_TABLET | Freq: Every day | ORAL | 1 refills | Status: AC

## 2024-01-15 NOTE — Patient Instructions (Signed)
" °  Please have your blood work / labs drawn today (or at your earliest possible convenience). You will receive a call from our office with the results and any next steps.  ----------   You will receive a call to schedule your Sleep Study.  ----------   Please schedule a follow up appointment with me when you check out today.  ----------   "

## 2024-01-15 NOTE — Progress Notes (Signed)
 "  Synopsis: Referred in 11/2023 for OSA by Micheal Wolm ORN, MD  Subjective:   PATIENT ID: Jeffery Barrett GENDER: male DOB: [birth year], MRN: [id number]  Chief Complaint  Patient presents with   Consult    Fatigue and wakes up 3-6x during the night.    HPI  Jeffery Barrett is a 29 y.o. patient with RLS and OSA who presents today for evaluation and management of previously diagnosed moderate OSA (in 2019 by HST). Patient did not have financial means to pursue treatment at that time. He believes he has become more symptomatic and now is in a better financial / insurance position to pursue treatment with CPAP and wishes to do so.  Pt also provides a compelling clinical history of RLS. Reports a family history of this in multiple family members. Has been on Mirapex  0.25 mg at bedtime PRN for this for some time, prescribed by his PCP. Reports benefit from this medication in terms of symptom relief.  I see no iron studies on file. Never tried gabapentin  (enacarbil or otherwise).  He is interested in GLP-1 agonist therapy for management of his morbid obesity (BMI 54.6 today) but since he is pre-diabetic he reports he has not been able to qualify for the medication.  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 He denies overt sleepiness when driving though falls asleep easily as a passenger of a vehicle. His EDS symptoms are significantly greater than suggested by his ESS score.  Pulm Questionnaires:     01/15/2024    3:00 PM 11/01/2017    2:00 PM  Results of the Epworth flowsheet  Sitting and reading 0 0  Watching TV 2 2  Sitting, inactive in a public place (e.g. a theatre or a meeting) 0 0  As a passenger in a car for an hour without a break 3 3  Lying down to rest in the afternoon when circumstances permit 2 2  Sitting and talking to someone 0 0  Sitting quietly after a lunch  without alcohol 0 0  In a car, while stopped for a few minutes in traffic 0 0  Total score 7 7     Past Medical History:  Diagnosis Date   Depression    Headache      Family History  Problem Relation Age of Onset   Hypertension Mother    Miscarriages / Stillbirths Mother    Thyroid  disease Mother    Depression Father    Hearing loss Father    Obstructive Sleep Apnea Father    Depression Sister    Polycystic ovary syndrome Sister    Interstitial cystitis Sister    Cancer Maternal Grandmother    Lung cancer Maternal Grandmother    COPD Maternal Grandfather    Early death Maternal Grandfather    Arthritis Paternal Grandmother    Heart disease Paternal Grandmother    Hypertension Paternal Grandmother    Stroke Paternal Grandmother    Diabetes Paternal Grandmother    Depression Paternal Grandfather    Heart disease Paternal Grandfather    Scoliosis Paternal Grandfather    Scoliosis Maternal Aunt      History reviewed. No pertinent surgical history.  Social History   Socioeconomic History   Marital status: Single    Spouse name: Not on file   Number of children: Not on file   Years of education: Not on file   Highest education level:  Associate degree: occupational, scientist, product/process development, or vocational program  Occupational History   Occupation: Comptroller: [employer]  Tobacco Use   Smoking status: Former    Types: E-cigarettes   Smokeless tobacco: Never  Vaping Use   Vaping status: Former  Substance and Sexual Activity   Alcohol use: Yes    Comment: social   Drug use: No   Sexual activity: Not on file  Other Topics Concern   Not on file  Social History Narrative   Patient is right-handed. He lives with his parents in a 2 story house with basement. His bedroom is on the main level. He occasionally drinks tea, avoids caffeine otherwise. He has been unable to work out at the gym due to back and shoulder pain in the last several months.    Social Drivers of Health   Tobacco Use: Medium Risk (01/15/2024)   Patient History    Smoking Tobacco Use: Former    Smokeless Tobacco Use: Never    Passive Exposure: Not on Actuary Strain: Not on file  Food Insecurity: Not on file  Transportation Needs: Not on file  Physical Activity: Not on file  Stress: Not on file  Social Connections: Not on file  Intimate Partner Violence: Not on file  Depression (PHQ2-9): Low Risk (09/12/2023)   Depression (PHQ2-9)    PHQ-2 Score: 0  Alcohol Screen: Not on file  Housing: Not on file  Utilities: Not on file  Health Literacy: Not on file     Allergies[1]   Outpatient Medications Prior to Visit  Medication Sig Dispense Refill   pramipexole  (MIRAPEX ) 0.25 MG tablet TAKE 1 TABLET BY MOUTH AT BEDTIME. 90 tablet 1   MELATONIN PO Take by mouth at bedtime. (Patient not taking: Reported on 01/15/2024)     semaglutide -weight management (WEGOVY ) 0.25 MG/0.5ML SOAJ SQ injection Inject 0.25 mg into the skin once a week. (Patient not taking: Reported on 01/15/2024) 2 mL 0   sertraline  (ZOLOFT ) 100 MG tablet TAKE 1 TABLET BY MOUTH EVERY DAY (Patient not taking: Reported on 01/15/2024) 90 tablet 1   tirzepatide  (MOUNJARO ) 2.5 MG/0.5ML Pen Inject 2.5 mg into the skin once a week. (Patient not taking: Reported on 01/15/2024) 2 mL 0   tirzepatide  (ZEPBOUND ) 2.5 MG/0.5ML injection vial Inject 2.5 mg into the skin once a week. (Patient not taking: Reported on 01/15/2024) 2 mL 1   No facility-administered medications prior to visit.    ROS   Objective:  Physical Exam Vitals reviewed.  Constitutional:      Appearance: Normal appearance. He is obese.  HENT:     Head: Normocephalic and atraumatic.  Eyes:     General: No scleral icterus.       Right eye: No discharge.        Left eye: No discharge.     Conjunctiva/sclera: Conjunctivae normal.  Pulmonary:     Effort: Pulmonary effort is normal.  Neurological:     Mental Status: He is  alert and oriented to person, place, and time. Mental status is at baseline.  Psychiatric:        Behavior: Behavior normal.        Thought Content: Thought content normal.        Judgment: Judgment normal.      Vitals:   01/15/24 1503  BP: 110/70  Pulse: 91  TempSrc: Oral  SpO2: 97%  Weight: (!) 359 lb (162.8 kg)  Height: 5' 8 (1.727 m)  97% on RA BMI Readings from Last 3 Encounters:  01/15/24 54.59 kg/m  11/21/23 54.29 kg/m  09/12/23 54.61 kg/m   Wt Readings from Last 3 Encounters:  01/15/24 (!) 359 lb (162.8 kg)  11/21/23 (!) 362 lb 6.4 oz (164.4 kg)  09/12/23 (!) 364 lb 8 oz (165.3 kg)     CBC    Component Value Date/Time   WBC 10.0 09/12/2023 1600   RBC 5.27 09/12/2023 1600   HGB 14.6 09/12/2023 1600   HCT 43.9 09/12/2023 1600   PLT 358.0 09/12/2023 1600   MCV 83.4 09/12/2023 1600   MCHC 33.3 09/12/2023 1600   RDW 14.2 09/12/2023 1600   LYMPHSABS 2.3 09/12/2023 1600   MONOABS 0.8 09/12/2023 1600   EOSABS 0.3 09/12/2023 1600   BASOSABS 0.1 09/12/2023 1600    Serum HCO3: CO2  Date/Time Value Ref Range Status  09/12/2023 04:00 PM 24 19 - 32 mEq/L Final  03/09/2021 04:01 PM 25 19 - 32 mEq/L Final  10/06/2017 02:32 PM 27 19 - 32 mEq/L Final    Hemoglobin A1c:    Component Value Date/Time   HGBA1C 6.0 09/12/2023 1600   HGBA1C 5.4 03/09/2021 1601    TSH: TSH  Date/Time Value Ref Range Status  09/12/2023 04:00 PM 4.98 0.35 - 5.50 uIU/mL Final  03/09/2021 04:01 PM 2.84 0.35 - 5.50 uIU/mL Final  10/17/2018 03:48 PM 2.17 0.35 - 4.50 uIU/mL Final    Iron Studies: No results found for: IRON, TIBC, IRONPCTSAT, FERRITIN  ABG: No results found for: PHART, PCO2ART, PO2ART, HCO3, TCO2, ACIDBASEDEF, O2SAT   VBG: No results found for: PHVEN, PCO2VEN, PO2VEN   Chest Imaging: N/A  Pulmonary Functions Testing Results:     No data to display         Pathology: N/A  Echocardiogram: N/A  Heart  Catheterization: N/A  Sleep Studies: HST (2019): AHI3% 17.3/hr, SpO2 nadir 83%. Weight 307 lbs.    Assessment & Plan:     ICD-10-CM   1. OSA (obstructive sleep apnea)  G47.33 modafinil  (PROVIGIL ) 200 MG tablet    Home sleep test    2. RLS (restless legs syndrome)  G25.81 Iron, TIBC and Ferritin Panel    3. Morbid obesity due to excess calories (HCC)  E66.01     4. BMI 50.0-59.9, adult [facility])  Z68.43       Discussion:  Prior diagnosis of Moderate OSA Comorbid Morbid Obesity (BMI 54.6) - Order HST to re-qualify the patient for treatment with CPAP. - Patient amenable to CPAP with P-10 nasal pillows + chin strap. Recommend 8-20 cm H2O as initial empiric settings. - Pt agreeable to Zepbound  treatment initiation if he meets criteria for its use for moderate-to-severe OSA. - Ordered modafinil  200 mg daily for management of his EDS symptoms / maintain safety (especially given his job and the amount of driving he needs to do between work sites) -- at least until we can get him firmly initiated on PAP therapy. Discussed use of GoodRx coupon for now - patient in agreement. Counseled regarding risk of small increase in BP and to immediately stop medication in case of new rash.  RLS - Ordered iron studies - Continue Mirapex  for now - Counseled patient that iron supplementation (when appropriate) and gabapentin  are considered first-line therapies for RLS by current guidelines, whereas Mirapex  has fallen out of favor due to its undesirable side effect profile including risk of augmentation, and increasing impulsivity (including but not limited to compulsive gambling, spending, etc -- even in  individuals with no history of these issues). - His partner voiced her valid concerns about reported psychiatric effects of gabapentin  including FDA warning regarding increased risk of suicidal thoughts and behaviors. In the interest of time and since we do not have iron studies available yet I suggested we  defer further discussion to the next visit, but did inform her and the patient that there are mixed perspectives regarding this warning label and these need to be balanced against the safety profile of the alternative treatment options.  Follow up: 3-4 months.  Current Medications[2]   Time spent on day of this encounter (includes time spent face-to-face with the patient as well as time spent the same day as the encounter reviewing existing data and notes, and/or documenting my findings and the plan of care): 45 minutes  Lamar Dales, MD Pulmonary, Critical Care & Sleep Medicine [facility] 01/15/2024 7:59 PM     [1]  Allergies Allergen Reactions   Amoxicillin Hives   Cephalosporins Hives  [2]  Current Outpatient Medications:    modafinil  (PROVIGIL ) 200 MG tablet, Take 1 tablet (200 mg total) by mouth daily., Disp: 90 tablet, Rfl: 1   pramipexole  (MIRAPEX ) 0.25 MG tablet, TAKE 1 TABLET BY MOUTH AT BEDTIME., Disp: 90 tablet, Rfl: 1  "

## 2024-01-16 LAB — IRON,TIBC AND FERRITIN PANEL
%SAT: 19 % — ABNORMAL LOW (ref 20–48)
Ferritin: 64 ng/mL (ref 38–380)
Iron: 62 ug/dL (ref 50–195)
TIBC: 327 ug/dL (ref 250–425)

## 2024-01-17 ENCOUNTER — Ambulatory Visit: Payer: Self-pay | Admitting: Pulmonary Disease

## 2024-01-17 NOTE — Progress Notes (Signed)
 Hi Heather - would you mind calling this patient and advising them that their iron studies were below the levels recommended for Restless Legs Syndrome?  I recommend they start taking an over-the-counter iron supplement 3 times per week -- Mon, Wed, Fri. They can ask the pharmacist at the [facility] etc to point out an appropriate supplement for them.  Caution regarding constipating effects and possible GI upset. If they are unable to tolerate oral iron they should let the office know and we can arrange for alternative method of iron supplementation. Otherwise I'll discuss further with them at follow up.  Thanks!

## 2024-01-19 NOTE — Progress Notes (Signed)
 I called and spoke with patient, advised of results/recommendations per Dr. Olena.  He verbalized understanding.  Nothing further needed.

## 2024-04-15 ENCOUNTER — Ambulatory Visit: Payer: PRIVATE HEALTH INSURANCE | Admitting: Pulmonary Disease
